# Patient Record
Sex: Female | Born: 1945 | Race: White | Hispanic: No | Marital: Married | Smoking: Never smoker
Health system: Southern US, Community
[De-identification: ages and names within clinical notes are randomized; demographics above are authoritative.]

## PROBLEM LIST (undated history)

## (undated) DIAGNOSIS — IMO0001 Reserved for inherently not codable concepts without codable children: Secondary | ICD-10-CM

## (undated) DIAGNOSIS — M1990 Unspecified osteoarthritis, unspecified site: Secondary | ICD-10-CM

## (undated) DIAGNOSIS — Z98891 History of uterine scar from previous surgery: Secondary | ICD-10-CM

## (undated) DIAGNOSIS — E789 Disorder of lipoprotein metabolism, unspecified: Secondary | ICD-10-CM

## (undated) DIAGNOSIS — R030 Elevated blood-pressure reading, without diagnosis of hypertension: Secondary | ICD-10-CM

## (undated) DIAGNOSIS — Z9049 Acquired absence of other specified parts of digestive tract: Secondary | ICD-10-CM

## (undated) DIAGNOSIS — E7800 Pure hypercholesterolemia, unspecified: Secondary | ICD-10-CM

## (undated) DIAGNOSIS — C439 Malignant melanoma of skin, unspecified: Secondary | ICD-10-CM

## (undated) DIAGNOSIS — I10 Essential (primary) hypertension: Secondary | ICD-10-CM

## (undated) DIAGNOSIS — E785 Hyperlipidemia, unspecified: Secondary | ICD-10-CM

## (undated) DIAGNOSIS — C801 Malignant (primary) neoplasm, unspecified: Secondary | ICD-10-CM

## (undated) DIAGNOSIS — Z9089 Acquired absence of other organs: Secondary | ICD-10-CM

## (undated) HISTORY — DX: Disorder of lipoprotein metabolism, unspecified: E78.9

## (undated) HISTORY — DX: Elevated blood-pressure reading, without diagnosis of hypertension: R03.0

## (undated) HISTORY — DX: Hyperlipidemia, unspecified: E78.5

## (undated) HISTORY — PX: MOHS SURGERY: SUR867

## (undated) HISTORY — DX: Malignant melanoma of skin, unspecified: C43.9

## (undated) HISTORY — DX: History of uterine scar from previous surgery: Z98.891

## (undated) HISTORY — DX: Essential (primary) hypertension: I10

## (undated) HISTORY — DX: Acquired absence of other organs: Z90.89

## (undated) HISTORY — DX: Acquired absence of other specified parts of digestive tract: Z90.49

## (undated) HISTORY — DX: Pure hypercholesterolemia, unspecified: E78.00

## (undated) HISTORY — DX: Malignant (primary) neoplasm, unspecified: C80.1

## (undated) HISTORY — DX: Unspecified osteoarthritis, unspecified site: M19.90

## (undated) HISTORY — DX: Reserved for inherently not codable concepts without codable children: IMO0001

## (undated) HISTORY — PX: APPENDECTOMY: SHX54

---

## 1963-10-11 DIAGNOSIS — Z9089 Acquired absence of other organs: Secondary | ICD-10-CM

## 1963-10-11 HISTORY — DX: Acquired absence of other organs: Z90.89

## 1963-10-11 HISTORY — PX: TONSILLECTOMY: SUR1361

## 1997-12-05 ENCOUNTER — Ambulatory Visit (HOSPITAL_COMMUNITY): Admission: RE | Admit: 1997-12-05 | Discharge: 1997-12-05 | Payer: Self-pay | Admitting: Obstetrics and Gynecology

## 1999-02-15 ENCOUNTER — Other Ambulatory Visit: Admission: RE | Admit: 1999-02-15 | Discharge: 1999-02-15 | Payer: Self-pay | Admitting: Obstetrics and Gynecology

## 1999-11-22 ENCOUNTER — Ambulatory Visit (HOSPITAL_COMMUNITY): Admission: RE | Admit: 1999-11-22 | Discharge: 1999-11-22 | Payer: Self-pay | Admitting: Obstetrics and Gynecology

## 1999-11-22 ENCOUNTER — Encounter: Payer: Self-pay | Admitting: Obstetrics and Gynecology

## 2000-01-04 ENCOUNTER — Other Ambulatory Visit: Admission: RE | Admit: 2000-01-04 | Discharge: 2000-01-04 | Payer: Self-pay | Admitting: Obstetrics and Gynecology

## 2000-01-04 ENCOUNTER — Encounter (INDEPENDENT_AMBULATORY_CARE_PROVIDER_SITE_OTHER): Payer: Self-pay | Admitting: Specialist

## 2000-02-17 ENCOUNTER — Other Ambulatory Visit: Admission: RE | Admit: 2000-02-17 | Discharge: 2000-02-17 | Payer: Self-pay | Admitting: Obstetrics and Gynecology

## 2001-03-15 ENCOUNTER — Other Ambulatory Visit: Admission: RE | Admit: 2001-03-15 | Discharge: 2001-03-15 | Payer: Self-pay | Admitting: Obstetrics and Gynecology

## 2002-02-05 ENCOUNTER — Ambulatory Visit (HOSPITAL_COMMUNITY): Admission: RE | Admit: 2002-02-05 | Discharge: 2002-02-05 | Payer: Self-pay | Admitting: Obstetrics and Gynecology

## 2002-02-05 ENCOUNTER — Encounter: Payer: Self-pay | Admitting: Obstetrics and Gynecology

## 2002-04-09 ENCOUNTER — Other Ambulatory Visit: Admission: RE | Admit: 2002-04-09 | Discharge: 2002-04-09 | Payer: Self-pay | Admitting: Internal Medicine

## 2003-04-11 ENCOUNTER — Other Ambulatory Visit: Admission: RE | Admit: 2003-04-11 | Discharge: 2003-04-11 | Payer: Self-pay | Admitting: Internal Medicine

## 2003-05-13 ENCOUNTER — Encounter: Payer: Self-pay | Admitting: Internal Medicine

## 2003-05-13 ENCOUNTER — Encounter: Admission: RE | Admit: 2003-05-13 | Discharge: 2003-05-13 | Payer: Self-pay | Admitting: Internal Medicine

## 2004-05-04 ENCOUNTER — Ambulatory Visit (HOSPITAL_COMMUNITY): Admission: RE | Admit: 2004-05-04 | Discharge: 2004-05-04 | Payer: Self-pay | Admitting: Internal Medicine

## 2006-02-03 ENCOUNTER — Emergency Department (HOSPITAL_COMMUNITY): Admission: EM | Admit: 2006-02-03 | Discharge: 2006-02-03 | Payer: Self-pay | Admitting: Emergency Medicine

## 2006-02-07 ENCOUNTER — Ambulatory Visit (HOSPITAL_COMMUNITY): Admission: RE | Admit: 2006-02-07 | Discharge: 2006-02-07 | Payer: Self-pay | Admitting: Orthopedic Surgery

## 2006-04-27 ENCOUNTER — Other Ambulatory Visit: Admission: RE | Admit: 2006-04-27 | Discharge: 2006-04-27 | Payer: Self-pay | Admitting: Internal Medicine

## 2006-10-13 ENCOUNTER — Ambulatory Visit (HOSPITAL_COMMUNITY): Admission: RE | Admit: 2006-10-13 | Discharge: 2006-10-13 | Payer: Self-pay | Admitting: Internal Medicine

## 2007-07-03 ENCOUNTER — Other Ambulatory Visit: Admission: RE | Admit: 2007-07-03 | Discharge: 2007-07-03 | Payer: Self-pay | Admitting: Internal Medicine

## 2008-07-01 ENCOUNTER — Other Ambulatory Visit: Admission: RE | Admit: 2008-07-01 | Discharge: 2008-07-01 | Payer: Self-pay | Admitting: Internal Medicine

## 2008-08-28 ENCOUNTER — Ambulatory Visit: Payer: Self-pay | Admitting: Internal Medicine

## 2009-02-18 ENCOUNTER — Ambulatory Visit: Payer: Self-pay | Admitting: Internal Medicine

## 2009-08-07 ENCOUNTER — Ambulatory Visit: Payer: Self-pay | Admitting: Internal Medicine

## 2009-08-07 ENCOUNTER — Other Ambulatory Visit: Admission: RE | Admit: 2009-08-07 | Discharge: 2009-08-07 | Payer: Self-pay | Admitting: Internal Medicine

## 2009-08-28 ENCOUNTER — Ambulatory Visit (HOSPITAL_COMMUNITY): Admission: RE | Admit: 2009-08-28 | Discharge: 2009-08-28 | Payer: Self-pay | Admitting: Internal Medicine

## 2010-02-18 ENCOUNTER — Ambulatory Visit: Payer: Self-pay | Admitting: Internal Medicine

## 2011-05-18 ENCOUNTER — Encounter: Payer: Self-pay | Admitting: Family Medicine

## 2011-05-18 ENCOUNTER — Ambulatory Visit (INDEPENDENT_AMBULATORY_CARE_PROVIDER_SITE_OTHER): Payer: Medicare Other | Admitting: Family Medicine

## 2011-05-18 ENCOUNTER — Other Ambulatory Visit: Payer: Self-pay | Admitting: Family Medicine

## 2011-05-18 DIAGNOSIS — L940 Localized scleroderma [morphea]: Secondary | ICD-10-CM

## 2011-05-18 DIAGNOSIS — L900 Lichen sclerosus et atrophicus: Secondary | ICD-10-CM

## 2011-05-18 DIAGNOSIS — I10 Essential (primary) hypertension: Secondary | ICD-10-CM | POA: Insufficient documentation

## 2011-05-18 DIAGNOSIS — C439 Malignant melanoma of skin, unspecified: Secondary | ICD-10-CM

## 2011-05-18 DIAGNOSIS — E119 Type 2 diabetes mellitus without complications: Secondary | ICD-10-CM

## 2011-05-18 DIAGNOSIS — E7800 Pure hypercholesterolemia, unspecified: Secondary | ICD-10-CM

## 2011-05-18 HISTORY — DX: Pure hypercholesterolemia, unspecified: E78.00

## 2011-05-18 HISTORY — DX: Malignant melanoma of skin, unspecified: C43.9

## 2011-05-18 MED ORDER — CLOBETASOL PROP EMOLLIENT BASE 0.05 % EX CREA: 1.0000 "application " | TOPICAL_CREAM | CUTANEOUS | Status: DC

## 2011-05-18 MED ORDER — LISINOPRIL 10 MG PO TABS: 10.0000 mg | ORAL_TABLET | Freq: Every day | ORAL | Status: DC

## 2011-05-18 NOTE — Progress Notes (Signed)
  Subjective:    Patient ID: Courtney Crosby, female    DOB: 13-May-1946, 65 y.o.   MRN: [id number]  HPI Comments: Has a discoloration and vulvar irritation for 1.5 years.  Has tried estrogen topically, anti-fungals and no real relief.  Told by PCP to have this looked into.     Review of Systems  Constitutional: Negative for fever, activity change, appetite change and fatigue.  HENT: Negative for nosebleeds, rhinorrhea, sneezing, neck pain, postnasal drip and sinus pressure.   Eyes: Negative for redness.  Respiratory: Negative for cough, chest tightness, shortness of breath and wheezing.   Cardiovascular: Negative for chest pain and leg swelling.  Gastrointestinal: Negative for abdominal pain and abdominal distention.  Genitourinary: Negative for dysuria, frequency, decreased urine volume, vaginal bleeding and pelvic pain.       Vulvar irritation  Musculoskeletal: Negative for myalgias and joint swelling.  Skin: Negative for color change.  Neurological: Negative for speech difficulty, numbness and headaches.       Objective:   Physical Exam  Constitutional: She is oriented to person, place, and time. She appears well-developed and well-nourished.  HENT:  Head: Normocephalic and atraumatic.  Neck: Normal range of motion.  Cardiovascular: Normal rate.   Pulmonary/Chest: Effort normal.  Abdominal: Soft.  Genitourinary:       Labia majora are hypopigmented and with some loss of integrity  Musculoskeletal: Normal range of motion.  Neurological: She is alert and oriented to person, place, and time.   Patient identified, informed consent signed, copy in chart, time out performed.    Area cleansed with Alcohol.  Injected with 1% Lidocaine with Epi.  3 mL. Area cleaned with Betadine and 2 mm punch biopsy performed without difficulty.  Hemostasis obtained with Silver Nitrate.  Patient tolerated procedure well.      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  With results Clobetasol.

## 2011-05-18 NOTE — Progress Notes (Signed)
Has a discoloration in perineal area that her primary care would like [place] to check out.

## 2011-07-06 ENCOUNTER — Encounter: Payer: Self-pay | Admitting: Internal Medicine

## 2011-07-07 ENCOUNTER — Other Ambulatory Visit: Payer: Medicare Other | Admitting: Internal Medicine

## 2011-07-07 DIAGNOSIS — I10 Essential (primary) hypertension: Secondary | ICD-10-CM

## 2011-07-07 DIAGNOSIS — E785 Hyperlipidemia, unspecified: Secondary | ICD-10-CM

## 2011-07-07 DIAGNOSIS — Z0000 Encounter for general adult medical examination without abnormal findings: Secondary | ICD-10-CM

## 2011-07-07 LAB — LIPID PANEL
Cholesterol: 220 mg/dL — ABNORMAL HIGH (ref 0–200)
Total CHOL/HDL Ratio: 5 Ratio
Triglycerides: 162 mg/dL — ABNORMAL HIGH (ref <150)
VLDL: 32 mg/dL (ref 0–40)

## 2011-07-07 LAB — CBC WITH DIFFERENTIAL/PLATELET
Basophils Relative: 1 % (ref 0–1)
Eosinophils Absolute: 0.1 10*3/uL (ref 0.0–0.7)
Eosinophils Relative: 2 % (ref 0–5)
Lymphs Abs: 2.2 10*3/uL (ref 0.7–4.0)
MCV: 91.7 fL (ref 78.0–100.0)
Neutro Abs: 2.9 10*3/uL (ref 1.7–7.7)
Platelets: 228 10*3/uL (ref 150–400)
RBC: 5.03 MIL/uL (ref 3.87–5.11)
RDW: 13.5 % (ref 11.5–15.5)

## 2011-07-07 LAB — COMPREHENSIVE METABOLIC PANEL
ALT: 21 U/L (ref 0–35)
Albumin: 4.2 g/dL (ref 3.5–5.2)
CO2: 27 mEq/L (ref 19–32)
Glucose, Bld: 108 mg/dL — ABNORMAL HIGH (ref 70–99)
Potassium: 4.4 mEq/L (ref 3.5–5.3)

## 2011-07-07 LAB — TSH: TSH: 1.364 u[IU]/mL (ref 0.350–4.500)

## 2011-07-07 LAB — HEMOGLOBIN A1C: Mean Plasma Glucose: 140 mg/dL — ABNORMAL HIGH (ref <117)

## 2011-07-11 ENCOUNTER — Encounter: Payer: Self-pay | Admitting: Internal Medicine

## 2011-07-11 ENCOUNTER — Ambulatory Visit (INDEPENDENT_AMBULATORY_CARE_PROVIDER_SITE_OTHER): Payer: Medicare Other | Admitting: Internal Medicine

## 2011-07-11 VITALS — BP 128/78 | HR 80 | Temp 97.5°F | Ht 63.0 in | Wt 185.0 lb

## 2011-07-11 DIAGNOSIS — I10 Essential (primary) hypertension: Secondary | ICD-10-CM

## 2011-07-11 DIAGNOSIS — F419 Anxiety disorder, unspecified: Secondary | ICD-10-CM

## 2011-07-11 DIAGNOSIS — R609 Edema, unspecified: Secondary | ICD-10-CM

## 2011-07-11 DIAGNOSIS — Z0000 Encounter for general adult medical examination without abnormal findings: Secondary | ICD-10-CM

## 2011-07-11 DIAGNOSIS — K219 Gastro-esophageal reflux disease without esophagitis: Secondary | ICD-10-CM

## 2011-07-11 LAB — POCT URINALYSIS DIPSTICK
Glucose, UA: NEGATIVE
Ketones, UA: NEGATIVE
Nitrite, UA: NEGATIVE
Spec Grav, UA: 1.01

## 2011-07-11 MED ORDER — LISINOPRIL 10 MG PO TABS: 10.0000 mg | ORAL_TABLET | Freq: Every day | ORAL | Status: DC

## 2011-08-14 DIAGNOSIS — K219 Gastro-esophageal reflux disease without esophagitis: Secondary | ICD-10-CM | POA: Insufficient documentation

## 2011-08-14 DIAGNOSIS — R609 Edema, unspecified: Secondary | ICD-10-CM | POA: Insufficient documentation

## 2011-08-14 DIAGNOSIS — F419 Anxiety disorder, unspecified: Secondary | ICD-10-CM | POA: Insufficient documentation

## 2011-08-14 NOTE — Patient Instructions (Signed)
Please try the diet exercise and lose some weight. Encourage you to watch Accu-Cheks at least once daily. Low-fat diet. Calorie restriction 1500 calories daily.

## 2011-08-14 NOTE — Progress Notes (Signed)
  Subjective:    Patient ID: Courtney Crosby, female    DOB: 07-05-1946, 65 y.o.   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 History of hypertension, GE reflux, hyperlipidemia, diabetes mellitus, dependent edema, anxiety.  History of melanoma right arm, herpes zoster left trunk 2006, fractured right humerus April 2007. Patient declines Pneumovax immunization. Sees Dr. Emily Filbert for eye exam every 2 years.Tdap 2007.  History of anxiety related to caring for relatives. Retirement has relieved some of the stress.  Does not smoke or consume alcohol. Is married. Husband is retired from the police department. 2 adult children a son and a daughter. Has a brother and a sister. Mother with history of diabetes arthritis and high blood pressure.    Review of Systems  Constitutional: Negative.   HENT: Negative.   Eyes: Negative.   Respiratory: Negative.   Cardiovascular: Negative.   Genitourinary: Negative.   Musculoskeletal: Positive for arthralgias.  Neurological: Negative.   Hematological: Negative.   Psychiatric/Behavioral:       Anxiety from dealing with relatives       Objective:   Physical Exam  Vitals reviewed. Constitutional: She is oriented to person, place, and time. She appears well-developed and well-nourished. No distress.  HENT:  Head: Normocephalic and atraumatic.  Left Ear: External ear normal.  Mouth/Throat: Oropharynx is clear and moist.  Eyes: Conjunctivae and EOM are normal. Pupils are equal, round, and reactive to light. No scleral icterus.  Neck: Neck supple. No JVD present. No thyromegaly present.  Cardiovascular: Normal rate, regular rhythm, normal heart sounds and intact distal pulses.   No murmur heard. Abdominal: Soft. Bowel sounds are normal. She exhibits no mass. There is no tenderness. There is no rebound.  Musculoskeletal: She exhibits no edema.       Diabetic foot exam shows no ulcers.    Lymphadenopathy:    She has no cervical adenopathy.  Neurological: She is alert and oriented to person, place, and time. She has normal reflexes. No cranial nerve deficit. Coordination normal.  Skin: Skin is warm and dry. No rash noted. She is not diaphoretic.  Psychiatric: She has a normal mood and affect. Her behavior is normal.          Assessment & Plan:  Diabetes mellitus  Hyperlipidemia  Hypertension  GE reflux  Anxiety  Dependent edema  Plan: Patient prefers not to be on diabetic medication at this point in time. Declines Pneumovax immunization. Does not want to be a lipid-lowering medication either. Return in 6 months for fasting lipid panel and hemoglobin A1c. Encouraged diet and exercise.

## 2011-10-21 ENCOUNTER — Other Ambulatory Visit: Payer: Self-pay | Admitting: Dermatology

## 2012-01-09 ENCOUNTER — Other Ambulatory Visit: Payer: Medicare Other | Admitting: Internal Medicine

## 2012-01-09 DIAGNOSIS — E785 Hyperlipidemia, unspecified: Secondary | ICD-10-CM

## 2012-01-09 LAB — LIPID PANEL
Cholesterol: 222 mg/dL — ABNORMAL HIGH (ref 0–200)
Total CHOL/HDL Ratio: 4.8 Ratio

## 2012-01-09 LAB — HEMOGLOBIN A1C: Mean Plasma Glucose: 143 mg/dL — ABNORMAL HIGH (ref <117)

## 2012-01-10 ENCOUNTER — Ambulatory Visit (INDEPENDENT_AMBULATORY_CARE_PROVIDER_SITE_OTHER): Payer: Medicare Other | Admitting: Internal Medicine

## 2012-01-10 ENCOUNTER — Encounter: Payer: Self-pay | Admitting: Internal Medicine

## 2012-01-10 VITALS — BP 148/94 | HR 76 | Wt 192.0 lb

## 2012-01-10 DIAGNOSIS — E119 Type 2 diabetes mellitus without complications: Secondary | ICD-10-CM

## 2012-01-10 DIAGNOSIS — E785 Hyperlipidemia, unspecified: Secondary | ICD-10-CM

## 2012-01-10 DIAGNOSIS — E669 Obesity, unspecified: Secondary | ICD-10-CM

## 2012-01-10 DIAGNOSIS — I10 Essential (primary) hypertension: Secondary | ICD-10-CM

## 2012-01-10 MED ORDER — CLOBETASOL PROP EMOLLIENT BASE 0.05 % EX CREA: 1.0000 "application " | TOPICAL_CREAM | CUTANEOUS | Status: DC

## 2012-01-10 MED ORDER — LISINOPRIL 5 MG PO TABS: 5.0000 mg | ORAL_TABLET | Freq: Every day | ORAL | Status: DC

## 2012-01-10 MED ORDER — ESTRADIOL 0.1 MG/GM VA CREA: 2.0000 g | TOPICAL_CREAM | Freq: Every day | VAGINAL | Status: DC

## 2012-02-05 NOTE — Progress Notes (Signed)
  Subjective:    Patient ID: Courtney Crosby, female    DOB: 1946/09/29, 66 y.o.   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 Since retiring, hasn't really committed to diet exercise and weight loss. Enjoying being retired however.    Review of Systems     Objective:   Physical Exam chest clear to auscultation; cardiac exam regular rate and rhythm normal S1 and S2; extremities without edema. Diabetic foot exam normal without ulcers.        Assessment & Plan:  Hyperlipidemia  Hypertension  Obesity  Diabetes mellitus type 2  Plan: Encouraged diet exercise and weight loss. Return in 6 months for physical examination. Patient does not want to take medication for diabetes.

## 2012-02-05 NOTE — Patient Instructions (Signed)
Please try the diet exercise and lose weight. Return in 6 months for physical exam.

## 2012-03-14 ENCOUNTER — Telehealth: Payer: Self-pay | Admitting: Internal Medicine

## 2012-03-14 NOTE — Telephone Encounter (Signed)
Lipitor prior to Crestor which she also couldn't do.  I asked her if she had advised you that the Crestor didn't work.  She said "no".  I can't take that mess and try to accomplish anything.  I said...do you think there is value in getting your labs even though you are NOT taking the medicine to see how your cholesterol is at this point?  She said, NO!  I just want to schedule my year exam.  So, I cancelled her July appointment.  However, I wanted you to know that she is NOT taking the cholesterol medicine nor was she interested in getting her labs drawn to keep a check on it.  So, her CPE is not until 10/4 (labs 10/3).  Just an FYI for you.  Thanks!

## 2012-03-14 NOTE — Telephone Encounter (Signed)
OK. Note patient not able to take Crestor and next appt is CPE in October. Manage with diet and exercise.

## 2012-04-17 ENCOUNTER — Other Ambulatory Visit: Payer: Medicare Other | Admitting: Internal Medicine

## 2012-04-19 ENCOUNTER — Ambulatory Visit: Payer: Medicare Other | Admitting: Internal Medicine

## 2012-07-12 ENCOUNTER — Other Ambulatory Visit: Payer: Medicare Other | Admitting: Internal Medicine

## 2012-07-12 DIAGNOSIS — E119 Type 2 diabetes mellitus without complications: Secondary | ICD-10-CM

## 2012-07-12 DIAGNOSIS — I10 Essential (primary) hypertension: Secondary | ICD-10-CM

## 2012-07-12 DIAGNOSIS — E785 Hyperlipidemia, unspecified: Secondary | ICD-10-CM

## 2012-07-12 LAB — CBC WITH DIFFERENTIAL/PLATELET
HCT: 45.2 % (ref 36.0–46.0)
Lymphocytes Relative: 41 % (ref 12–46)
MCH: 30.1 pg (ref 26.0–34.0)
MCHC: 34.3 g/dL (ref 30.0–36.0)
MCV: 87.8 fL (ref 78.0–100.0)
Monocytes Absolute: 0.6 10*3/uL (ref 0.1–1.0)
Neutro Abs: 2.9 10*3/uL (ref 1.7–7.7)
Neutrophils Relative %: 46 % (ref 43–77)
WBC: 6.1 10*3/uL (ref 4.0–10.5)

## 2012-07-12 LAB — TSH: TSH: 1.455 u[IU]/mL (ref 0.350–4.500)

## 2012-07-12 LAB — COMPREHENSIVE METABOLIC PANEL
AST: 29 U/L (ref 0–37)
Albumin: 4 g/dL (ref 3.5–5.2)
Chloride: 102 mEq/L (ref 96–112)
Sodium: 139 mEq/L (ref 135–145)
Total Bilirubin: 0.6 mg/dL (ref 0.3–1.2)

## 2012-07-12 LAB — LIPID PANEL
Cholesterol: 202 mg/dL — ABNORMAL HIGH (ref 0–200)
LDL Cholesterol: 116 mg/dL — ABNORMAL HIGH (ref 0–99)

## 2012-07-12 LAB — HEMOGLOBIN A1C: Mean Plasma Glucose: 143 mg/dL — ABNORMAL HIGH (ref <117)

## 2012-07-13 ENCOUNTER — Encounter: Payer: Self-pay | Admitting: Internal Medicine

## 2012-07-13 ENCOUNTER — Ambulatory Visit (INDEPENDENT_AMBULATORY_CARE_PROVIDER_SITE_OTHER): Payer: Medicare Other | Admitting: Internal Medicine

## 2012-07-13 ENCOUNTER — Other Ambulatory Visit (HOSPITAL_COMMUNITY)
Admission: RE | Admit: 2012-07-13 | Discharge: 2012-07-13 | Disposition: A | Discharge status: Home / Self Care | Payer: Medicare Other | Source: Ambulatory Visit | Attending: Internal Medicine | Admitting: Internal Medicine

## 2012-07-13 VITALS — BP 128/88 | HR 76 | Ht 63.0 in | Wt 190.0 lb

## 2012-07-13 DIAGNOSIS — F411 Generalized anxiety disorder: Secondary | ICD-10-CM

## 2012-07-13 DIAGNOSIS — E8881 Metabolic syndrome: Secondary | ICD-10-CM

## 2012-07-13 DIAGNOSIS — E669 Obesity, unspecified: Secondary | ICD-10-CM

## 2012-07-13 DIAGNOSIS — E785 Hyperlipidemia, unspecified: Secondary | ICD-10-CM

## 2012-07-13 DIAGNOSIS — I10 Essential (primary) hypertension: Secondary | ICD-10-CM

## 2012-07-13 DIAGNOSIS — K219 Gastro-esophageal reflux disease without esophagitis: Secondary | ICD-10-CM

## 2012-07-13 DIAGNOSIS — E119 Type 2 diabetes mellitus without complications: Secondary | ICD-10-CM

## 2012-07-13 DIAGNOSIS — Z85820 Personal history of malignant melanoma of skin: Secondary | ICD-10-CM

## 2012-07-13 DIAGNOSIS — Z01419 Encounter for gynecological examination (general) (routine) without abnormal findings: Secondary | ICD-10-CM | POA: Insufficient documentation

## 2012-07-13 DIAGNOSIS — Z124 Encounter for screening for malignant neoplasm of cervix: Secondary | ICD-10-CM

## 2012-07-13 LAB — POCT URINALYSIS DIPSTICK
Blood, UA: NEGATIVE
Glucose, UA: NEGATIVE
Nitrite, UA: NEGATIVE
Spec Grav, UA: 1.015
pH, UA: 5.5

## 2012-07-13 LAB — VITAMIN D 25 HYDROXY (VIT D DEFICIENCY, FRACTURES): Vit D, 25-Hydroxy: 39 ng/mL (ref 30–89)

## 2012-07-13 NOTE — Patient Instructions (Addendum)
Continue same medications and return in 6 months. Watch diet and exercise. Try to lose weight.

## 2012-12-15 NOTE — Progress Notes (Signed)
Subjective:    Patient ID: Courtney Crosby, female    DOB: 12-Oct-1945, 67 y.o.   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 Enjoying retirement. History of hypertension, type 2 diabetes mellitus, hyperlipidemia, obesity, metabolic syndrome for health maintenance and evaluation of medical problems. History of dependent edema, anxiety related to family stress, GERD, melanoma right arm [address]81, [id number] zoster left trunk 2006; Fractured right humerus 2007  SHx: married with 2 adult children- a son and a daughter. Husband is retired Emergency planning/management officer. Nonsmoker No alcohol consumtion.  Appendectomy bilateral ingrown toe nail surgery, C-section 1976 and 89 FHx Mother with hx DM, HTN    Review of Systems  Constitutional: Negative.   HENT: Negative.   Eyes: Negative.   Respiratory: Negative.   Cardiovascular: Negative.   Gastrointestinal:       GERD  Endocrine: Negative.   Neurological: Negative.   Hematological: Negative.   Psychiatric/Behavioral:       Anxiety related to family stress       Objective:   Physical Exam  Vitals reviewed. Constitutional: She is oriented to person, place, and time. She appears well-developed and well-nourished. No distress.  HENT:  Head: Normocephalic and atraumatic.  Right Ear: External ear normal.  Left Ear: External ear normal.  Mouth/Throat: Oropharynx is clear and moist.  Eyes: Conjunctivae and EOM are normal. Pupils are equal, round, and reactive to light. Right eye exhibits no discharge. Left eye exhibits no discharge. No scleral icterus.  Neck: Neck supple. No JVD present. No thyromegaly present.  Cardiovascular: Normal rate, regular rhythm, normal heart sounds and intact distal pulses.   No murmur heard. Pulmonary/Chest: Effort normal and breath sounds normal. No respiratory distress. She has no wheezes. She has no rales. She exhibits no tenderness.   Breasts normal female.  Abdominal: Soft. Bowel sounds are normal. She exhibits no distension and no mass. There is no tenderness. There is no rebound and no guarding.  Genitourinary:  Pap taken. Bimanual normal  Musculoskeletal: Normal range of motion.  Trace LE edema  Lymphadenopathy:    She has no cervical adenopathy.  Neurological: She is alert and oriented to person, place, and time. She has normal reflexes. No cranial nerve deficit. Coordination normal.  Skin: Skin is warm and dry. No rash noted. She is not diaphoretic.  Psychiatric: She has a normal mood and affect. Her behavior is normal. Judgment and thought content normal.          Assessment & Plan:  HTN Hyperlipidemia GERD Anxiety related to family stress. Tries to take care of everyone and gets worn out. Type 2 diabetes mellitus controlled with diet does not want to be on medication Plan: Return in 6 months for recheck with lipid panel, Hgb AIC, and OV Declines to be on lipid lowering medication, decline Pneumovax immunization, declines to be on med for DM   Subjective:   Patient presents for Medicare Annual/Subsequent preventive examination.   Review Past Medical/Family/Social: see above   Risk Factors  Current exercise habits: sedentary Dietary issues discussed: low fat low carb  Cardiac risk factors: Obesity , HTN, DM, Hyperlipidemia  Depression Screen  (Note: if answer to either of the following is "Yes", a more complete depression screening is indicated)  Over the past two weeks, have you felt down, depressed or hopeless? no  Over the past two weeks, have you felt little interest or pleasure in doing things? no Have  you lost interest or pleasure in daily life? no Do you often feel hopeless? no Do you cry easily over simple problems? No   Activities of Daily Living  In your present state of health, do you have any difficulty performing the following activities?:  Driving? No  Managing money? No   Feeding yourself? No  Getting from bed to chair? No  Climbing a flight of stairs? No  Preparing food and eating?: No  Bathing or showering? No  Getting dressed: No  Getting to the toilet? No  Using the toilet:No  Moving around from place to place: No  In the past year have you fallen or had a near fall?:No  Are you sexually active? No  Do you have more than one partner? No   Hearing Difficulties: No  Do you often ask people to speak up or repeat themselves? No  Do you experience ringing or noises in your ears? no Do you have difficulty understanding soft or whispered voices? No  Do you feel that you have a problem with memory? no Do you often misplace items? No  Do you feel safe at home? Yes   Cognitive Testing  Alert? Yes Normal Appearance?Yes  Oriented to person? Yes Place? Yes  Time? Yes  Recall of three objects? Yes  Can perform simple calculations? Yes  Displays appropriate judgment?Yes  Can read the correct time from a watch face?Yes   List the Names of Other Physician/Practitioners you currently use:  opthamologist   Indicate any recent Medical Services you may have received from other than [facility] providers in the past year (date may be approximate).   Screening Tests / Date  See above Colonoscopy                     Zostavax  Mammogram  Influenza Vaccine  Tetanus/tdap   Objective:    Body mass index:  BP     Pulse     Ht       Wt       BMI      SpO2   See VS above    General appearance: Appears stated age and mildly obese  Head: Normocephalic, without obvious abnormality, atraumatic  Eyes: conj clear, EOMi PEERLA  Ears: normal TM's and external ear canals both ears  Nose: Nares normal. Septum midline. Mucosa normal. No drainage or sinus tenderness.  Throat: lips, mucosa, and tongue normal; teeth and gums normal  Neck: no adenopathy, no carotid bruit, no JVD, supple, symmetrical, trachea midline and thyroid not enlarged, symmetric, no  tenderness/mass/nodules  No CVA tenderness.  Lungs: clear to auscultation bilaterally  Breasts: normal appearance, no masses or tenderness Heart: regular rate and rhythm, S1, S2 normal, no murmur, click, rub or gallop  Abdomen: soft, non-tender; bowel sounds normal; no masses, no organomegaly  Musculoskeletal: ROM normal in all joints, no crepitus, no deformity, Normal muscle strengthen. Back  is symmetric, no curvature. Skin: Skin color, texture, turgor normal. No rashes or lesions  Lymph nodes: Cervical, supraclavicular, and axillary nodes normal.  Neurologic: CN 2 -12 Normal, Normal symmetric reflexes. Normal coordination and gait  Psych: Alert & Oriented x 3, Mood appear stable.    Assessment:    Annual wellness medicare exam   Plan:    Refuses pneumovax; refuses lipid lowering and diabetic medication. Remind about yearly eye exam . Remind about annual maamogram Diet review for nutrition referral? Yes ____ Not Indicated __x__ refused Patient Instructions (the written plan) was given  to the patient.  Medicare Attestation  I have personally reviewed:  The patient's medical and social history  Their use of alcohol, tobacco or illicit drugs  Their current medications and supplements  The patient's functional ability including ADLs,fall risks, home safety risks, cognitive, and hearing and visual impairment  Diet and physical activities  Evidence for depression or mood disorders  The patient's weight, height, BMI, and visual acuity have been recorded in the chart. I have made referrals, counseling, and provided education to the patient based on review of the above and I have provided the patient with a written personalized care plan for preventive services.

## 2013-01-14 ENCOUNTER — Ambulatory Visit (INDEPENDENT_AMBULATORY_CARE_PROVIDER_SITE_OTHER): Payer: Medicare Other | Admitting: Internal Medicine

## 2013-01-14 VITALS — BP 132/84 | Temp 98.0°F | Wt 189.0 lb

## 2013-01-14 DIAGNOSIS — E119 Type 2 diabetes mellitus without complications: Secondary | ICD-10-CM

## 2013-01-14 DIAGNOSIS — E669 Obesity, unspecified: Secondary | ICD-10-CM

## 2013-01-14 DIAGNOSIS — E785 Hyperlipidemia, unspecified: Secondary | ICD-10-CM

## 2013-01-14 DIAGNOSIS — I10 Essential (primary) hypertension: Secondary | ICD-10-CM

## 2013-01-14 DIAGNOSIS — E8881 Metabolic syndrome: Secondary | ICD-10-CM

## 2013-01-14 LAB — HEMOGLOBIN A1C: Hgb A1c MFr Bld: 6.7 % — ABNORMAL HIGH (ref <5.7)

## 2013-01-14 NOTE — Progress Notes (Signed)
  Subjective:    Patient ID: Courtney Crosby, female    DOB: [birth year], 67 y.o.   MRN: [id number]  HPI Patient in today to followup on type 2 diabetes mellitus, hypertension and hyperlipidemia. Does not want to be a lipid-lowering therapy. Remains overweight. Has metabolic syndrome. I had hoped with retirement she would be able to diet and exercise bit more but she says she's had some stress in her family.    Review of Systems     Objective:   Physical Exam skin is warm and dry. Nodes none. Neck supple without JVD thyromegaly or carotid bruits. Chest clear to auscultation. Cardiac exam regular rate and rhythm normal S1 and S2. Extremities without edema. C. diabetic foot form.        Assessment & Plan:  HTN-stable on lisinopril  Hyperlipidemia- does not want to be on lipid lowering meds and is not fasting today. Recheck in 6 months at time of PE.  Type 2 diabetes mellitus-hemoglobin A1c 6.7%  Plan: Reminded regarding health maintenance services such as annual mammogram, annual diabetic eye exam. Return in 6 months. Encouraged diet exercise and weight loss. Order given for mammogram.

## 2013-01-14 NOTE — Patient Instructions (Addendum)
Continue same meds and return in 6 months for PE

## 2013-04-01 ENCOUNTER — Other Ambulatory Visit: Payer: Self-pay | Admitting: Internal Medicine

## 2013-07-10 ENCOUNTER — Encounter: Payer: Self-pay | Admitting: Internal Medicine

## 2013-07-10 ENCOUNTER — Other Ambulatory Visit: Payer: Self-pay | Admitting: Internal Medicine

## 2013-07-10 DIAGNOSIS — Z780 Asymptomatic menopausal state: Secondary | ICD-10-CM

## 2013-07-10 DIAGNOSIS — Z1231 Encounter for screening mammogram for malignant neoplasm of breast: Secondary | ICD-10-CM

## 2013-07-18 ENCOUNTER — Other Ambulatory Visit: Payer: Medicare Other | Admitting: Internal Medicine

## 2013-07-18 DIAGNOSIS — E119 Type 2 diabetes mellitus without complications: Secondary | ICD-10-CM

## 2013-07-18 DIAGNOSIS — E559 Vitamin D deficiency, unspecified: Secondary | ICD-10-CM

## 2013-07-18 DIAGNOSIS — I10 Essential (primary) hypertension: Secondary | ICD-10-CM

## 2013-07-18 DIAGNOSIS — E785 Hyperlipidemia, unspecified: Secondary | ICD-10-CM

## 2013-07-18 DIAGNOSIS — Z130 Encounter for screening for diseases of the blood and blood-forming organs and certain disorders involving the immune mechanism: Secondary | ICD-10-CM

## 2013-07-18 DIAGNOSIS — E039 Hypothyroidism, unspecified: Secondary | ICD-10-CM

## 2013-07-18 LAB — COMPREHENSIVE METABOLIC PANEL
ALT: 35 U/L (ref 0–35)
Alkaline Phosphatase: 57 U/L (ref 39–117)
BUN: 10 mg/dL (ref 6–23)
Chloride: 104 mEq/L (ref 96–112)
Creat: 0.69 mg/dL (ref 0.50–1.10)
Sodium: 140 mEq/L (ref 135–145)
Total Bilirubin: 0.7 mg/dL (ref 0.3–1.2)

## 2013-07-18 LAB — CBC WITH DIFFERENTIAL/PLATELET
Basophils Absolute: 0 10*3/uL (ref 0.0–0.1)
Eosinophils Absolute: 0.1 10*3/uL (ref 0.0–0.7)
Eosinophils Relative: 2 % (ref 0–5)
Hemoglobin: 15.7 g/dL — ABNORMAL HIGH (ref 12.0–15.0)
Lymphocytes Relative: 37 % (ref 12–46)
Lymphs Abs: 2.1 10*3/uL (ref 0.7–4.0)
MCV: 86.9 fL (ref 78.0–100.0)
Monocytes Relative: 11 % (ref 3–12)
RBC: 5.27 MIL/uL — ABNORMAL HIGH (ref 3.87–5.11)

## 2013-07-18 LAB — LIPID PANEL
Cholesterol: 200 mg/dL (ref 0–200)
LDL Cholesterol: 122 mg/dL — ABNORMAL HIGH (ref 0–99)
Total CHOL/HDL Ratio: 4.8 Ratio
Triglycerides: 178 mg/dL — ABNORMAL HIGH (ref <150)
VLDL: 36 mg/dL (ref 0–40)

## 2013-07-18 LAB — HEMOGLOBIN A1C: Mean Plasma Glucose: 151 mg/dL — ABNORMAL HIGH (ref <117)

## 2013-07-19 ENCOUNTER — Ambulatory Visit (INDEPENDENT_AMBULATORY_CARE_PROVIDER_SITE_OTHER): Payer: Medicare Other | Admitting: Internal Medicine

## 2013-07-19 ENCOUNTER — Encounter: Payer: Self-pay | Admitting: Internal Medicine

## 2013-07-19 VITALS — BP 150/96 | HR 60 | Temp 98.0°F | Ht 63.0 in | Wt 186.0 lb

## 2013-07-19 DIAGNOSIS — F411 Generalized anxiety disorder: Secondary | ICD-10-CM

## 2013-07-19 DIAGNOSIS — E8881 Metabolic syndrome: Secondary | ICD-10-CM

## 2013-07-19 DIAGNOSIS — E669 Obesity, unspecified: Secondary | ICD-10-CM

## 2013-07-19 DIAGNOSIS — K219 Gastro-esophageal reflux disease without esophagitis: Secondary | ICD-10-CM

## 2013-07-19 DIAGNOSIS — Z0000 Encounter for general adult medical examination without abnormal findings: Secondary | ICD-10-CM

## 2013-07-19 DIAGNOSIS — E119 Type 2 diabetes mellitus without complications: Secondary | ICD-10-CM

## 2013-07-19 DIAGNOSIS — E785 Hyperlipidemia, unspecified: Secondary | ICD-10-CM

## 2013-07-19 DIAGNOSIS — I10 Essential (primary) hypertension: Secondary | ICD-10-CM

## 2013-07-19 LAB — POCT URINALYSIS DIPSTICK
Bilirubin, UA: NEGATIVE
Protein, UA: NEGATIVE
Spec Grav, UA: 1.01

## 2013-07-19 MED ORDER — LISINOPRIL 10 MG PO TABS: 10.0000 mg | ORAL_TABLET | Freq: Every day | ORAL | Status: DC

## 2013-07-19 NOTE — Progress Notes (Signed)
Subjective:    Patient ID: Courtney Crosby, female    DOB: April 18, 1946, 67 y.o.   MRN: [id number]  HPI  67 year old White female with history of HTN, DM, hyperlipidemia, obesity in today for health maintenance exam. Patient says she does not tolerate Lipitor. Says she has tried other lipid-lowering medications in the past but cannot remember which ones. Explained to her that we need to get a better handle on her diabetic control, her blood pressure control and her lipid control. She's not exercising regularly. She has metabolic syndrome. She has history of dependent edema. She's been treated for precancerous lesions on her face and arms by dermatologist with fluorouracil.  She says she will receive influenza immunization October 21 at [facility]. Has yet to make appointment for mammogram but has been sent a requisition from this office in breast center has called her. She says she will do this at [facility]. She refuses Pneumovax immunization.  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 It is elevated today. She's only  on 5 mg lisinopril daily.  She has a remote history of melanoma 1991. History of dependent edema. History of anxiety. History of GE reflux.  History of herpes zoster left trunk 2006. Fractured right humerus 2007. History of appendectomy, history of bilateral ingrown toenail surgery, C-sections 1976 and 1978.  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 Husband is retired Nurse, adult. She has 2 adult children a son and a daughter. She is a nonsmoker. Does not consume alcohol.  Family history: Mother with history of diabetes mellitus and hypertension       Review of Systems  Constitutional: Positive for fatigue.  HENT: Negative.   Respiratory: Negative.   Cardiovascular: Negative.   Gastrointestinal:       GE reflux  Endocrine:       Controlled type 2 diabetes with diet   Genitourinary: Negative.   Allergic/Immunologic: Negative.   Neurological: Negative.   Hematological: Negative.   Psychiatric/Behavioral:       Minor anxiety related to family stress       Objective:   Physical Exam  Vitals reviewed. Constitutional: She is oriented to person, place, and time. She appears well-developed and well-nourished. No distress.  HENT:  Head: Normocephalic and atraumatic.  Right Ear: External ear normal.  Left Ear: External ear normal.  Mouth/Throat: Oropharynx is clear and moist. No oropharyngeal exudate.  Eyes: Conjunctivae and EOM are normal. Pupils are equal, round, and reactive to light. Right eye exhibits no discharge. Left eye exhibits no discharge. No scleral icterus.  Neck: Neck supple. No JVD present. No thyromegaly present.  Cardiovascular: Normal rate, regular rhythm, normal heart sounds and intact distal pulses.  Exam reveals no friction rub.   No murmur heard. Pulmonary/Chest: Effort normal and breath sounds normal. No respiratory distress. She has no rales. She exhibits no tenderness.  Breasts normal female but has nodule at 11:00 right breast which is firm  Abdominal: Soft. Bowel sounds are normal. She exhibits no distension and no mass. There is no tenderness. There is no rebound and no guarding.  Genitourinary:  Bimanual normal. Pap was done 2013  Musculoskeletal: Normal range of motion. She exhibits no edema.  Trace lower extremity edema. Diabetic foot exam is negative  Lymphadenopathy:    She has no cervical adenopathy.  Neurological: She is alert and oriented to person, place, and time. She has normal reflexes. No  cranial nerve deficit. Coordination normal.  Skin: Skin is warm and dry. No rash noted. She is not diaphoretic.  Psychiatric: She has a normal mood and affect. Her behavior is normal. Judgment and thought content normal.          Assessment & Plan:  Controlled type 2 diabetes. Her hemoglobin A1c has gone up to 6.9%. She  does not want be on diabetic medication. Advise diet and exercise. Will recheck in 3 months.  Hyperlipidemia-samples of Crestor 5 mg daily to be taken at supper given to patient. Recheck lipid panel liver functions in 3 months  Hypertension-increase lisinopril to 10 mg daily  Has nodule in right breast at 11:00. Recommend mammogram.  Obesity-recommend diet and exercise  Hyperlipidemia-start Crestor 5 mg daily and recheck in 3 months  Mild dependent edema-consider adding Diuril 8 if blood pressure does not respond to increasing lisinopril  History of GE reflux-not on prescription medication  History of anxiety-stable  Plan: See above and return in 3 months for nurse visit, blood pressure check, basic metabolic panel, A1c, lipid panel liver functions. Otherwise return in 6 months  Subjective:   Patient presents for Medicare Annual/Subsequent preventive examination.   Review Past Medical/Family/Social: see above   Risk Factors  Current exercise habits: sedentary Dietary issues discussed: yes  Cardiac risk factors:  Depression Screen  (Note: if answer to either of the following is "Yes", a more complete depression screening is indicated)   Over the past two weeks, have you felt down, depressed or hopeless? No  Over the past two weeks, have you felt little interest or pleasure in doing things? No Have you lost interest or pleasure in daily life? No Do you often feel hopeless? No Do you cry easily over simple problems? No   Activities of Daily Living  In your present state of health, do you have any difficulty performing the following activities?:   Driving? No  Managing money? No  Feeding yourself? No  Getting from bed to chair? No  Climbing a flight of stairs? No  Preparing food and eating?: No  Bathing or showering? No  Getting dressed: No  Getting to the toilet? No  Using the toilet:No  Moving around from place to place: No  In the past year have you fallen or had  a near fall?:No  Are you sexually active? No  Do you have more than one partner? No   Hearing Difficulties: No  Do you often ask people to speak up or repeat themselves? No  Do you experience ringing or noises in your ears? No  Do you have difficulty understanding soft or whispered voices? No  Do you feel that you have a problem with memory? No Do you often misplace items? No    Home Safety:  Do you have a smoke alarm at your residence? Yes Do you have grab bars in the bathroom?- Do you have throw rugs in your house?-   Cognitive Testing  Alert? Yes Normal Appearance?Yes  Oriented to person? Yes Place? Yes  Time? Yes  Recall of three objects? Yes  Can perform simple calculations? Yes  Displays appropriate judgment?Yes  Can read the correct time from a watch face?Yes   List the Names of Other Physician/Practitioners you currently use:  See referral list for the physicians patient is currently seeing. Dermatologist    Review of Systems:see above   Objective:     General appearance: Appears stated age and mildly obese  Head: Normocephalic, without obvious abnormality,  atraumatic  Eyes: conj clear, EOMi PEERLA  Ears: normal TM's and external ear canals both ears  Nose: Nares normal. Septum midline. Mucosa normal. No drainage or sinus tenderness.  Throat: lips, mucosa, and tongue normal; teeth and gums normal  Neck: no adenopathy, no carotid bruit, no JVD, supple, symmetrical, trachea midline and thyroid not enlarged, symmetric, no tenderness/mass/nodules  No CVA tenderness.  Lungs: clear to auscultation bilaterally  Breasts: normal appearance, no masses or tenderness. Nodule and 11:00 right breast Heart: regular rate and rhythm, S1, S2 normal, no murmur, click, rub or gallop  Abdomen: soft, non-tender; bowel sounds normal; no masses, no organomegaly  Musculoskeletal: ROM normal in all joints, no crepitus, no deformity, Normal muscle strengthen. Back  is symmetric, no  curvature. Skin: Skin color, texture, turgor normal. No rashes or lesions  Lymph nodes: Cervical, supraclavicular, and axillary nodes normal.  Neurologic: CN 2 -12 Normal, Normal symmetric reflexes. Normal coordination and gait  Psych: Alert & Oriented x 3, Mood appear stable.    Assessment:    Annual wellness medicare exam   Plan:    During the course of the visit the patient was educated and counseled about appropriate screening and preventive services including:   Annual mammogram Refuses pneumovax Annual flu vaccine     Patient Instructions (the written plan) was given to the patient.  Medicare Attestation  I have personally reviewed:  The patient's medical and social history  Their use of alcohol, tobacco or illicit drugs  Their current medications and supplements  The patient's functional ability including ADLs,fall risks, home safety risks, cognitive, and hearing and visual impairment  Diet and physical activities  Evidence for depression or mood disorders  The patient's weight, height, BMI, and visual acuity have been recorded in the chart. I have made referrals, counseling, and provided education to the patient based on review of the above and I have provided the patient with a written personalized care plan for preventive services.

## 2013-07-19 NOTE — Addendum Note (Signed)
Addended by: Fayne Mediate on: 07/19/2013 11:45 AM   Modules accepted: Orders

## 2013-07-19 NOTE — Patient Instructions (Signed)
Start Crestor 5 mg daily. Increase lisinopril to 10 mg daily. Return in 3 months for basic metabolic panel, TSH, lipid panel liver functions and blood pressure check (nurse visit) otherwise return in 6 months. Please have mammogram.

## 2013-08-07 ENCOUNTER — Ambulatory Visit (HOSPITAL_COMMUNITY)
Admission: RE | Admit: 2013-08-07 | Discharge: 2013-08-07 | Disposition: A | Discharge status: Home / Self Care | Payer: Medicare Other | Source: Ambulatory Visit | Attending: Internal Medicine | Admitting: Internal Medicine

## 2013-08-07 DIAGNOSIS — Z780 Asymptomatic menopausal state: Secondary | ICD-10-CM | POA: Insufficient documentation

## 2013-08-07 DIAGNOSIS — Z13820 Encounter for screening for osteoporosis: Secondary | ICD-10-CM | POA: Insufficient documentation

## 2013-08-07 DIAGNOSIS — Z1231 Encounter for screening mammogram for malignant neoplasm of breast: Secondary | ICD-10-CM | POA: Insufficient documentation

## 2013-10-01 ENCOUNTER — Other Ambulatory Visit: Payer: Self-pay | Admitting: Internal Medicine

## 2013-10-21 ENCOUNTER — Other Ambulatory Visit: Payer: Medicare Other | Admitting: Internal Medicine

## 2014-01-02 ENCOUNTER — Other Ambulatory Visit: Payer: Medicare Other | Admitting: Internal Medicine

## 2014-01-02 DIAGNOSIS — E785 Hyperlipidemia, unspecified: Secondary | ICD-10-CM

## 2014-01-02 DIAGNOSIS — E119 Type 2 diabetes mellitus without complications: Secondary | ICD-10-CM

## 2014-01-02 DIAGNOSIS — I10 Essential (primary) hypertension: Secondary | ICD-10-CM

## 2014-01-02 DIAGNOSIS — Z79899 Other long term (current) drug therapy: Secondary | ICD-10-CM

## 2014-01-02 LAB — HEMOGLOBIN A1C
Hgb A1c MFr Bld: 6.9 % — ABNORMAL HIGH (ref <5.7)
Mean Plasma Glucose: 151 mg/dL — ABNORMAL HIGH (ref <117)

## 2014-01-02 LAB — BASIC METABOLIC PANEL
BUN: 10 mg/dL (ref 6–23)
CALCIUM: 9.1 mg/dL (ref 8.4–10.5)
CO2: 25 mEq/L (ref 19–32)
Chloride: 105 mEq/L (ref 96–112)
Creat: 0.78 mg/dL (ref 0.50–1.10)
Glucose, Bld: 128 mg/dL — ABNORMAL HIGH (ref 70–99)
POTASSIUM: 4.4 meq/L (ref 3.5–5.3)
SODIUM: 139 meq/L (ref 135–145)

## 2014-01-02 LAB — HEPATIC FUNCTION PANEL
ALBUMIN: 3.9 g/dL (ref 3.5–5.2)
ALK PHOS: 51 U/L (ref 39–117)
ALT: 29 U/L (ref 0–35)
AST: 26 U/L (ref 0–37)
Bilirubin, Direct: 0.1 mg/dL (ref 0.0–0.3)
Indirect Bilirubin: 0.5 mg/dL (ref 0.2–1.2)
TOTAL PROTEIN: 6.4 g/dL (ref 6.0–8.3)
Total Bilirubin: 0.6 mg/dL (ref 0.2–1.2)

## 2014-01-02 LAB — LIPID PANEL
CHOLESTEROL: 192 mg/dL (ref 0–200)
HDL: 42 mg/dL (ref >39)
LDL Cholesterol: 120 mg/dL — ABNORMAL HIGH (ref 0–99)
Total CHOL/HDL Ratio: 4.6 Ratio
Triglycerides: 149 mg/dL (ref <150)
VLDL: 30 mg/dL (ref 0–40)

## 2014-01-14 ENCOUNTER — Ambulatory Visit (INDEPENDENT_AMBULATORY_CARE_PROVIDER_SITE_OTHER): Payer: Medicare Other | Admitting: Internal Medicine

## 2014-01-14 ENCOUNTER — Encounter: Payer: Self-pay | Admitting: Internal Medicine

## 2014-01-14 VITALS — BP 120/78 | HR 80 | Ht 63.0 in | Wt 186.0 lb

## 2014-01-14 DIAGNOSIS — E119 Type 2 diabetes mellitus without complications: Secondary | ICD-10-CM

## 2014-01-14 DIAGNOSIS — E8881 Metabolic syndrome: Secondary | ICD-10-CM

## 2014-01-14 DIAGNOSIS — E785 Hyperlipidemia, unspecified: Secondary | ICD-10-CM

## 2014-01-14 DIAGNOSIS — F411 Generalized anxiety disorder: Secondary | ICD-10-CM

## 2014-01-14 DIAGNOSIS — E669 Obesity, unspecified: Secondary | ICD-10-CM

## 2014-01-14 DIAGNOSIS — I10 Essential (primary) hypertension: Secondary | ICD-10-CM

## 2014-01-14 NOTE — Progress Notes (Signed)
   Subjective:    Patient ID: Courtney Crosby, female    DOB: 12/30/1945, 68 y.o.   MRN: [id number]  HPI  68 year old white female with history of diabetes mellitus that is diet controlled, hypertension treated with lisinopril, hyperlipidemia not on medication because she is statin intolerant, obesity, metabolic syndrome, in today for six-month recheck. Has been trying to diet exercise and lose weight but has not been successful in losing weight over the past 6 months. Has watched sweet consumption. Hemoglobin A1c stable at 7.7%. LDL stable at 120 on statin medication. Doesn't want to be on Zetia either. Anxiety has improved. Husband has had some issues with feeling woozy and lightheaded. No etiology has been found and that is a bit worrisome to her.  She recently had diabetic eye exam with Dr. Delman Cheadle in the last 2 weeks. See note in Epic that has been scanned.    Review of Systems     Objective:   Physical Exam  Skin warm and dry. Chest clear to auscultation. Cardiac exam regular rate and rhythm normal S1 and S2. Extremities without edema. Pulses are normal in feet. Diabetic foot exam without ulcers or calluses      Assessment & Plan:  Type 2 diabetes mellitus-controlled with diet  Hyperlipidemia-not on medication. Statin intolerant. LDL stable at 120  Anxiety-stable with sparing doses of Xanax  Hypertension-stable on lisinopril  Obesity-will continue to work on diet and exercise. Check TSH to rule out hypothyroidism.  Metabolic syndrome  Plan: Return in 6 months for physical examination.

## 2014-01-14 NOTE — Patient Instructions (Signed)
Continue work on diet and exercise. Return in 6 months. Continue same medications. TSH will be checked

## 2014-04-14 ENCOUNTER — Ambulatory Visit (INDEPENDENT_AMBULATORY_CARE_PROVIDER_SITE_OTHER): Payer: Medicare Other | Admitting: Internal Medicine

## 2014-04-14 ENCOUNTER — Encounter: Payer: Self-pay | Admitting: Internal Medicine

## 2014-04-14 ENCOUNTER — Ambulatory Visit
Admission: RE | Admit: 2014-04-14 | Discharge: 2014-04-14 | Disposition: A | Discharge status: Home / Self Care | Payer: Medicare Other | Source: Ambulatory Visit | Attending: Internal Medicine | Admitting: Internal Medicine

## 2014-04-14 VITALS — BP 144/90 | HR 76 | Temp 98.1°F | Wt 184.0 lb

## 2014-04-14 DIAGNOSIS — M5432 Sciatica, left side: Secondary | ICD-10-CM

## 2014-04-14 DIAGNOSIS — M5430 Sciatica, unspecified side: Secondary | ICD-10-CM

## 2014-04-14 DIAGNOSIS — M25559 Pain in unspecified hip: Secondary | ICD-10-CM

## 2014-04-14 DIAGNOSIS — M25552 Pain in left hip: Secondary | ICD-10-CM

## 2014-04-14 MED ORDER — CYCLOBENZAPRINE HCL 10 MG PO TABS: 10.0000 mg | ORAL_TABLET | Freq: Every day | ORAL | Status: DC

## 2014-04-14 NOTE — Patient Instructions (Signed)
Take Flexeril at bedtime. Order given for physical therapy for 2-4 weeks. Take Aleve. Have x-ray of left hip.
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
## 2014-04-14 NOTE — Progress Notes (Signed)
Patient informed. 

## 2014-07-21 ENCOUNTER — Other Ambulatory Visit: Payer: Medicare Other | Admitting: Internal Medicine

## 2014-07-21 DIAGNOSIS — Z0000 Encounter for general adult medical examination without abnormal findings: Secondary | ICD-10-CM

## 2014-07-21 DIAGNOSIS — I10 Essential (primary) hypertension: Secondary | ICD-10-CM

## 2014-07-21 DIAGNOSIS — E119 Type 2 diabetes mellitus without complications: Secondary | ICD-10-CM

## 2014-07-21 DIAGNOSIS — E7800 Pure hypercholesterolemia, unspecified: Secondary | ICD-10-CM

## 2014-07-21 NOTE — Addendum Note (Signed)
Addended by: Amado Coe on: 07/21/2014 09:47 AM   Modules accepted: Orders

## 2014-07-22 ENCOUNTER — Ambulatory Visit (INDEPENDENT_AMBULATORY_CARE_PROVIDER_SITE_OTHER): Payer: Medicare Other | Admitting: Internal Medicine

## 2014-07-22 ENCOUNTER — Encounter: Payer: Self-pay | Admitting: Internal Medicine

## 2014-07-22 VITALS — BP 130/88 | HR 78 | Temp 96.9°F | Resp 16 | Ht 63.5 in | Wt 184.0 lb

## 2014-07-22 DIAGNOSIS — E669 Obesity, unspecified: Secondary | ICD-10-CM

## 2014-07-22 DIAGNOSIS — Z0000 Encounter for general adult medical examination without abnormal findings: Secondary | ICD-10-CM

## 2014-07-22 DIAGNOSIS — Z8659 Personal history of other mental and behavioral disorders: Secondary | ICD-10-CM

## 2014-07-22 DIAGNOSIS — E8881 Metabolic syndrome: Secondary | ICD-10-CM

## 2014-07-22 DIAGNOSIS — E119 Type 2 diabetes mellitus without complications: Secondary | ICD-10-CM

## 2014-07-22 DIAGNOSIS — I10 Essential (primary) hypertension: Secondary | ICD-10-CM

## 2014-07-22 DIAGNOSIS — E785 Hyperlipidemia, unspecified: Secondary | ICD-10-CM

## 2014-07-22 LAB — POCT URINALYSIS DIPSTICK
Bilirubin, UA: NEGATIVE
Blood, UA: NEGATIVE
GLUCOSE UA: NEGATIVE
KETONES UA: NEGATIVE
Leukocytes, UA: NEGATIVE
Nitrite, UA: NEGATIVE
Protein, UA: NEGATIVE
SPEC GRAV UA: 1.015
Urobilinogen, UA: NEGATIVE
pH, UA: 6

## 2014-07-22 LAB — CBC WITH DIFFERENTIAL/PLATELET
BASOS ABS: 0 10*3/uL (ref 0.0–0.1)
BASOS PCT: 0 % (ref 0–1)
EOS ABS: 0.1 10*3/uL (ref 0.0–0.7)
EOS PCT: 2 % (ref 0–5)
HCT: 46.3 % — ABNORMAL HIGH (ref 36.0–46.0)
Hemoglobin: 15.6 g/dL — ABNORMAL HIGH (ref 12.0–15.0)
Lymphocytes Relative: 36 % (ref 12–46)
Lymphs Abs: 2.5 10*3/uL (ref 0.7–4.0)
MCH: 29.9 pg (ref 26.0–34.0)
MCHC: 33.7 g/dL (ref 30.0–36.0)
MCV: 88.9 fL (ref 78.0–100.0)
Monocytes Absolute: 0.7 10*3/uL (ref 0.1–1.0)
Monocytes Relative: 10 % (ref 3–12)
Neutro Abs: 3.6 10*3/uL (ref 1.7–7.7)
Neutrophils Relative %: 52 % (ref 43–77)
Platelets: 236 10*3/uL (ref 150–400)
RBC: 5.21 MIL/uL — ABNORMAL HIGH (ref 3.87–5.11)
RDW: 13.7 % (ref 11.5–15.5)
WBC: 6.9 10*3/uL (ref 4.0–10.5)

## 2014-07-22 LAB — LIPID PANEL
CHOL/HDL RATIO: 4.1 ratio
CHOLESTEROL: 194 mg/dL (ref 0–200)
HDL: 47 mg/dL (ref >39)
LDL Cholesterol: 115 mg/dL — ABNORMAL HIGH (ref 0–99)
TRIGLYCERIDES: 162 mg/dL — AB (ref <150)
VLDL: 32 mg/dL (ref 0–40)

## 2014-07-22 LAB — COMPREHENSIVE METABOLIC PANEL
ALK PHOS: 54 U/L (ref 39–117)
ALT: 28 U/L (ref 0–35)
AST: 25 U/L (ref 0–37)
Albumin: 4.1 g/dL (ref 3.5–5.2)
BILIRUBIN TOTAL: 0.6 mg/dL (ref 0.2–1.2)
BUN: 9 mg/dL (ref 6–23)
CO2: 23 meq/L (ref 19–32)
CREATININE: 0.74 mg/dL (ref 0.50–1.10)
Calcium: 9.3 mg/dL (ref 8.4–10.5)
Chloride: 106 mEq/L (ref 96–112)
Glucose, Bld: 125 mg/dL — ABNORMAL HIGH (ref 70–99)
Potassium: 4.4 mEq/L (ref 3.5–5.3)
SODIUM: 140 meq/L (ref 135–145)
TOTAL PROTEIN: 6.7 g/dL (ref 6.0–8.3)

## 2014-07-22 LAB — HEMOGLOBIN A1C
Hgb A1c MFr Bld: 6.9 % — ABNORMAL HIGH (ref <5.7)
Mean Plasma Glucose: 151 mg/dL — ABNORMAL HIGH (ref <117)

## 2014-07-22 LAB — TSH: TSH: 1.086 u[IU]/mL (ref 0.350–4.500)

## 2014-07-23 LAB — MICROALBUMIN, URINE: MICROALB UR: 1.1 mg/dL (ref <2.0)

## 2014-08-18 ENCOUNTER — Other Ambulatory Visit: Payer: Self-pay

## 2014-08-18 MED ORDER — LISINOPRIL 10 MG PO TABS: 10.0000 mg | ORAL_TABLET | Freq: Every day | ORAL | Status: DC

## 2014-09-07 NOTE — Progress Notes (Signed)
Subjective:    Patient ID: Courtney Crosby, female    DOB: 03-Mar-1946, 68 y.o.   MRN: [id number]  HPI 68 year old White Female in today for health maintenance exam and evaluation of medical issues. Patient has a history of controlled type 2 diabetes, hyperlipidemia, obesity, hypertension, metabolic syndrome, GE reflux, anxiety.  Patient says she does not tolerate Lipitor and is tried other lipid lowering medications but cannot remember which ones. She does not exercise regularly. She has a history of dependent edema. She has been treated for precancerous lesions on her face and arms by dermatologist with fluoroscopy urocele.  Remote history of melanoma 1991. History of herpes zoster left trunk 2006. Fractured right humerus 2007. History of appendectomy. History of ingrown toenail surgery bilaterally. Cecile insect is 1976 and 1978.  She refuses Pneumovax immunization.  Reminded regarding annual mammogram.  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 Husband is a retired Higher education careers adviser. She has 2 adult children, son and a daughter. She is a nonsmoker. Does not consume alcohol.  Family history: Mother with history of diabetes mellitus and hypertension    Review of Systems  HENT: Negative.   Respiratory: Negative.   Cardiovascular: Negative.   Gastrointestinal:       History of GE reflux  Psychiatric/Behavioral:       History of anxiety related to family issues       Objective:   Physical Exam  Constitutional: She is oriented to person, place, and time. She appears well-developed and well-nourished. No distress.  HENT:  Head: Normocephalic and atraumatic.  Right Ear: External ear normal.  Left Ear: External ear normal.  Mouth/Throat: Oropharynx is clear and moist. No oropharyngeal exudate.  Eyes: Conjunctivae and EOM are normal. Pupils are equal, round, and reactive to light. Right eye exhibits no discharge. Left eye exhibits no  discharge.  Neck: Neck supple. No JVD present. No thyromegaly present.  Cardiovascular: Normal rate, regular rhythm, normal heart sounds and intact distal pulses.   No murmur heard. Pulmonary/Chest: Effort normal and breath sounds normal. No respiratory distress. She has no wheezes. She has no rales.  Abdominal: Soft. Bowel sounds are normal. She exhibits no distension and no mass. There is no tenderness. There is no rebound and no guarding.  Genitourinary:  Bimanual normal. Pap done 2013.  Musculoskeletal: Normal range of motion. She exhibits no edema.  Lymphadenopathy:    She has no cervical adenopathy.  Neurological: She is alert and oriented to person, place, and time. No cranial nerve deficit. Coordination normal.  Skin: Skin is warm and dry. No rash noted. She is not diaphoretic.  Psychiatric: She has a normal mood and affect. Her behavior is normal. Judgment and thought content normal.  Vitals reviewed.         Assessment & Plan:  GE reflux-stable on PPI  History of anxiety-stable  Obesity-once again discussion regarding diet and exercise  Metabolic syndrome  Type 2 diabetes mellitus-stable. Ingrown A1c 6.9%  Hypertension-stable  Hyperlipidemia-does not tolerate statin medication. Triglycerides 162. LDL cholesterol 1115  Plan: Return in 6 months or as needed. Reiterate importance of diet exercise and weight loss.  Subjective:   Patient presents for Medicare Annual/Subsequent preventive examination.  Review Past Medical/Family/Social:   Risk Factors  Current exercise habits:  Dietary issues discussed:   Cardiac risk factors:  Depression Screen  (Note: if answer to either of the following is "Yes", a more complete depression screening is indicated)   Over  the past two weeks, have you felt down, depressed or hopeless? No  Over the past two weeks, have you felt little interest or pleasure in doing things? No Have you lost interest or pleasure in daily life?  No Do you often feel hopeless? No Do you cry easily over simple problems? No   Activities of Daily Living  In your present state of health, do you have any difficulty performing the following activities?:   Driving? No  Managing money? No  Feeding yourself? No  Getting from bed to chair? No  Climbing a flight of stairs? No  Preparing food and eating?: No  Bathing or showering? No  Getting dressed: No  Getting to the toilet? No  Using the toilet:No  Moving around from place to place: No  In the past year have you fallen or had a near fall?:No  Are you sexually active? No  Do you have more than one partner? No   Hearing Difficulties: No  Do you often ask people to speak up or repeat themselves? No  Do you experience ringing or noises in your ears? No  Do you have difficulty understanding soft or whispered voices? No  Do you feel that you have a problem with memory? No Do you often misplace items? No    Home Safety:  Do you have a smoke alarm at your residence? Yes Do you have grab bars in the bathroom?- Do you have throw rugs in your house?-   Cognitive Testing  Alert? Yes Normal Appearance?Yes  Oriented to person? Yes Place? Yes  Time? Yes  Recall of three objects? Yes  Can perform simple calculations? Yes  Displays appropriate judgment?Yes  Can read the correct time from a watch face?Yes   List the Names of Other Physician/Practitioners you currently use:  See referral list for the physicians patient is currently seeing.  Dermatologist   Review of Systems: See above   Objective:     General appearance: Appears stated age and mildly obese  Head: Normocephalic, without obvious abnormality, atraumatic  Eyes: conj clear, EOMi PEERLA  Ears: normal TM's and external ear canals both ears  Nose: Nares normal. Septum midline. Mucosa normal. No drainage or sinus tenderness.  Throat: lips, mucosa, and tongue normal; teeth and gums normal  Neck: no adenopathy, no  carotid bruit, no JVD, supple, symmetrical, trachea midline and thyroid not enlarged, symmetric, no tenderness/mass/nodules  No CVA tenderness.  Lungs: clear to auscultation bilaterally  Breasts: normal appearance, no masses or tenderness Heart: regular rate and rhythm, S1, S2 normal, no murmur, click, rub or gallop  Abdomen: soft, non-tender; bowel sounds normal; no masses, no organomegaly  Musculoskeletal: ROM normal in all joints, no crepitus, no deformity, Normal muscle strengthen. Back  is symmetric, no curvature. Skin: Skin color, texture, turgor normal. No rashes or lesions  Lymph nodes: Cervical, supraclavicular, and axillary nodes normal.  Neurologic: CN 2 -12 Normal, Normal symmetric reflexes. Normal coordination and gait  Psych: Alert & Oriented x 3, Mood appear stable.    Assessment:    Annual wellness medicare exam   Plan:    During the course of the visit the patient was educated and counseled about appropriate screening and preventive services including:   Recommend annual mammogram  Declines pneumonia vaccine  Recommend annual eye exam     Patient Instructions (the written plan) was given to the patient.  Medicare Attestation  I have personally reviewed:  The patient's medical and social history  Their use of  alcohol, tobacco or illicit drugs  Their current medications and supplements  The patient's functional ability including ADLs,fall risks, home safety risks, cognitive, and hearing and visual impairment  Diet and physical activities  Evidence for depression or mood disorders  The patient's weight, height, BMI, and visual acuity have been recorded in the chart. I have made referrals, counseling, and provided education to the patient based on review of the above and I have provided the patient with a written personalized care plan for preventive services.

## 2014-09-07 NOTE — Patient Instructions (Addendum)
Follow-up in 6 months. Continue same medications. Encouraged diet exercise and weight loss.

## 2014-09-15 ENCOUNTER — Other Ambulatory Visit: Payer: Self-pay | Admitting: Internal Medicine

## 2014-09-15 DIAGNOSIS — Z1231 Encounter for screening mammogram for malignant neoplasm of breast: Secondary | ICD-10-CM

## 2014-09-24 ENCOUNTER — Ambulatory Visit (HOSPITAL_COMMUNITY)
Admission: RE | Admit: 2014-09-24 | Discharge: 2014-09-24 | Disposition: A | Discharge status: Home / Self Care | Payer: Medicare Other | Source: Ambulatory Visit | Attending: Internal Medicine | Admitting: Internal Medicine

## 2014-09-24 DIAGNOSIS — Z1231 Encounter for screening mammogram for malignant neoplasm of breast: Secondary | ICD-10-CM | POA: Diagnosis not present

## 2014-12-09 ENCOUNTER — Other Ambulatory Visit: Payer: Self-pay | Admitting: Internal Medicine

## 2014-12-12 LAB — HM DIABETES EYE EXAM

## 2015-02-24 ENCOUNTER — Other Ambulatory Visit: Payer: Self-pay | Admitting: Internal Medicine

## 2015-06-05 ENCOUNTER — Encounter: Payer: Self-pay | Admitting: Internal Medicine

## 2015-07-27 ENCOUNTER — Other Ambulatory Visit: Payer: Medicare Other | Admitting: Internal Medicine

## 2015-07-27 ENCOUNTER — Other Ambulatory Visit: Payer: Self-pay | Admitting: Internal Medicine

## 2015-07-27 DIAGNOSIS — E119 Type 2 diabetes mellitus without complications: Secondary | ICD-10-CM

## 2015-07-27 DIAGNOSIS — I10 Essential (primary) hypertension: Secondary | ICD-10-CM

## 2015-07-27 DIAGNOSIS — E7800 Pure hypercholesterolemia, unspecified: Secondary | ICD-10-CM

## 2015-07-27 DIAGNOSIS — Z0000 Encounter for general adult medical examination without abnormal findings: Secondary | ICD-10-CM

## 2015-07-27 LAB — CBC WITH DIFFERENTIAL/PLATELET
BASOS ABS: 0.1 10*3/uL (ref 0.0–0.1)
Basophils Relative: 1 % (ref 0–1)
EOS ABS: 0.1 10*3/uL (ref 0.0–0.7)
EOS PCT: 2 % (ref 0–5)
HEMATOCRIT: 47.7 % — AB (ref 36.0–46.0)
Hemoglobin: 16 g/dL — ABNORMAL HIGH (ref 12.0–15.0)
LYMPHS PCT: 39 % (ref 12–46)
Lymphs Abs: 2.5 10*3/uL (ref 0.7–4.0)
MCH: 30.3 pg (ref 26.0–34.0)
MCHC: 33.5 g/dL (ref 30.0–36.0)
MCV: 90.3 fL (ref 78.0–100.0)
MONO ABS: 0.6 10*3/uL (ref 0.1–1.0)
MPV: 11.9 fL (ref 8.6–12.4)
Monocytes Relative: 10 % (ref 3–12)
Neutro Abs: 3.1 10*3/uL (ref 1.7–7.7)
Neutrophils Relative %: 48 % (ref 43–77)
PLATELETS: 222 10*3/uL (ref 150–400)
RBC: 5.28 MIL/uL — AB (ref 3.87–5.11)
RDW: 13.5 % (ref 11.5–15.5)
WBC: 6.4 10*3/uL (ref 4.0–10.5)

## 2015-07-27 LAB — COMPLETE METABOLIC PANEL WITH GFR
ALT: 34 U/L — AB (ref 6–29)
AST: 30 U/L (ref 10–35)
Albumin: 4 g/dL (ref 3.6–5.1)
Alkaline Phosphatase: 48 U/L (ref 33–130)
BUN: 10 mg/dL (ref 7–25)
CALCIUM: 9.6 mg/dL (ref 8.6–10.4)
CHLORIDE: 103 mmol/L (ref 98–110)
CO2: 28 mmol/L (ref 20–31)
Creat: 0.83 mg/dL (ref 0.60–0.93)
GFR, EST AFRICAN AMERICAN: 83 mL/min (ref >=60)
GFR, Est Non African American: 72 mL/min (ref >=60)
Glucose, Bld: 151 mg/dL — ABNORMAL HIGH (ref 65–99)
POTASSIUM: 4.3 mmol/L (ref 3.5–5.3)
Sodium: 140 mmol/L (ref 135–146)
Total Bilirubin: 0.9 mg/dL (ref 0.2–1.2)
Total Protein: 7 g/dL (ref 6.1–8.1)

## 2015-07-27 LAB — LIPID PANEL
CHOL/HDL RATIO: 5.9 ratio — AB (ref <=5.0)
CHOLESTEROL: 226 mg/dL — AB (ref 125–200)
HDL: 38 mg/dL — ABNORMAL LOW (ref >=46)
LDL CALC: 148 mg/dL — AB (ref <130)
TRIGLYCERIDES: 201 mg/dL — AB (ref <150)
VLDL: 40 mg/dL — AB (ref <30)

## 2015-07-27 LAB — TSH: TSH: 1.33 u[IU]/mL (ref 0.350–4.500)

## 2015-07-28 ENCOUNTER — Ambulatory Visit (INDEPENDENT_AMBULATORY_CARE_PROVIDER_SITE_OTHER): Payer: Medicare Other | Admitting: Internal Medicine

## 2015-07-28 ENCOUNTER — Telehealth: Payer: Self-pay

## 2015-07-28 ENCOUNTER — Encounter: Payer: Self-pay | Admitting: Internal Medicine

## 2015-07-28 VITALS — BP 138/90 | HR 91 | Temp 98.3°F | Ht 63.75 in | Wt 185.0 lb

## 2015-07-28 DIAGNOSIS — Z0000 Encounter for general adult medical examination without abnormal findings: Secondary | ICD-10-CM | POA: Diagnosis not present

## 2015-07-28 DIAGNOSIS — E8881 Metabolic syndrome: Secondary | ICD-10-CM

## 2015-07-28 DIAGNOSIS — Z789 Other specified health status: Secondary | ICD-10-CM

## 2015-07-28 DIAGNOSIS — I10 Essential (primary) hypertension: Secondary | ICD-10-CM | POA: Diagnosis not present

## 2015-07-28 DIAGNOSIS — E669 Obesity, unspecified: Secondary | ICD-10-CM | POA: Diagnosis not present

## 2015-07-28 DIAGNOSIS — Z889 Allergy status to unspecified drugs, medicaments and biological substances status: Secondary | ICD-10-CM | POA: Diagnosis not present

## 2015-07-28 DIAGNOSIS — F411 Generalized anxiety disorder: Secondary | ICD-10-CM | POA: Diagnosis not present

## 2015-07-28 DIAGNOSIS — E785 Hyperlipidemia, unspecified: Secondary | ICD-10-CM | POA: Diagnosis not present

## 2015-07-28 DIAGNOSIS — E119 Type 2 diabetes mellitus without complications: Secondary | ICD-10-CM | POA: Diagnosis not present

## 2015-07-28 DIAGNOSIS — K219 Gastro-esophageal reflux disease without esophagitis: Secondary | ICD-10-CM

## 2015-07-28 LAB — POCT URINALYSIS DIPSTICK
BILIRUBIN UA: NEGATIVE
GLUCOSE UA: NEGATIVE
Ketones, UA: NEGATIVE
Leukocytes, UA: NEGATIVE
Nitrite, UA: NEGATIVE
Protein, UA: NEGATIVE
RBC UA: NEGATIVE
SPEC GRAV UA: 1.02
UROBILINOGEN UA: NEGATIVE
pH, UA: 6

## 2015-07-28 LAB — HEMOGLOBIN A1C
Hgb A1c MFr Bld: 7.6 % — ABNORMAL HIGH (ref <5.7)
Mean Plasma Glucose: 171 mg/dL — ABNORMAL HIGH (ref <117)

## 2015-07-28 MED ORDER — EZETIMIBE 10 MG PO TABS
10.0000 mg | ORAL_TABLET | Freq: Every day | ORAL | Status: DC
Start: 2015-07-28 — End: 2016-03-08

## 2015-07-28 NOTE — Telephone Encounter (Signed)
Per Dr Renold Genta A1c needs to be added to lab work.  Called [facility] and added order using elevated glucose serum as diagnosis.

## 2015-07-28 NOTE — Patient Instructions (Signed)
Try Zetia 10 mg daily. Must lose weight, diet and exercise. RTC in 3 months

## 2015-07-28 NOTE — Progress Notes (Signed)
Subjective:    Patient ID: Courtney Crosby, female    DOB: 10/02/1946, 69 y.o.   MRN: [id number]  HPI 69 year old female in today for health maintenance exam and evaluation of medical issues. Refuses flu and Prevnar vaccines. History of type 2 diabetes mellitus, hyperlipidemia, essential hypertension, obesity, anxiety, metabolic syndrome.  Patient says she does not tolerate Lipitor and has tried other lipid-lowering medications but cannot remember which ones. She does not exercise regularly. She has a history of dependent edema. Has been treated for precancerous lesions on her face and arms by dermatologist.  History of GE reflux. Remote history of melanoma 1991. Herpes zoster left trunk 2006. Fractured right humerus 2007. History of appendectomy. History of ingrown toenail surgically removed bilaterally. Cesarean sections 1976 and 1978.  Patient reminded about annual mammogram.  Family history: Mother with history of diabetes mellitus and hypertension.  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 2 adult children, son and a daughter. Does not consume alcohol. Nonsmoker.    Review of Systems history of GE reflux. History of anxiety related to family issues.     Objective:   Physical Exam  Constitutional: She is oriented to person, place, and time. She appears well-developed and well-nourished. No distress.  HENT:  Head: Normocephalic and atraumatic.  Right Ear: External ear normal.  Left Ear: External ear normal.  Mouth/Throat: Oropharynx is clear and moist. No oropharyngeal exudate.  Eyes: Conjunctivae and EOM are normal. Pupils are equal, round, and reactive to light. Right eye exhibits no discharge. Left eye exhibits no discharge.  Neck: Neck supple. No JVD present. No thyromegaly present.  Cardiovascular: Normal rate and regular rhythm.   No murmur heard. Pulmonary/Chest: Effort normal and breath  sounds normal. No respiratory distress. She has no rales. She exhibits no tenderness.  Breasts normal female without masses  Abdominal: Soft. Bowel sounds are normal. She exhibits no distension and no mass. There is no rebound and no guarding.  Genitourinary:  Pap done 2013. Not repeated due to age. Bimanual normal.  Musculoskeletal: She exhibits no edema.  Lymphadenopathy:    She has no cervical adenopathy.  Neurological: She is alert and oriented to person, place, and time. She has normal reflexes. No cranial nerve deficit.  Skin: Skin is warm and dry. No rash noted. She is not diaphoretic.  Psychiatric: She has a normal mood and affect. Her behavior is normal. Judgment and thought content normal.  Vitals reviewed.         Assessment & Plan:  Controlled type 2 diabetes mellitus without insulin-hemoglobin A1c increased to 6.9%. Control can be better. Still think she's been trying very hard with diet and exercise.  Anxiety  GE reflux-stable on PPI  Metabolic syndrome  Obesity  Hypertension  Hyperlipidemia-does not tolerate statin therapy. Cholesterol has worsened. Total cholesterol increased from 194-226. Triglycerides increased from 162-201. LDL cholesterol increased from 115 to 148. Try Zetia since she is statin intolerant. Return in 3 months for follow-up. Get serious about diet and exercise.  Plan: Zetia 10 mg daily. Encouraged diet exercise and weight loss. Return in 3 months.  Subjective:   Patient presents for Medicare Annual/Subsequent preventive examination.  Review Past Medical/Family/Social: See above   Risk Factors  Current exercise habits: Sedentary Dietary issues discussed: Low fat low carb  Cardiac risk factors: Metabolic syndrome, hyperlipidemia, diabetes mellitus, family history of diabetes in mother  Depression Screen  (Note: if answer to  either of the following is "Yes", a more complete depression screening is indicated)   Over the past two weeks,  have you felt down, depressed or hopeless? No  Over the past two weeks, have you felt little interest or pleasure in doing things? No Have you lost interest or pleasure in daily life? No Do you often feel hopeless? No Do you cry easily over simple problems? No   Activities of Daily Living  In your present state of health, do you have any difficulty performing the following activities?:   Driving? No  Managing money? No  Feeding yourself? No  Getting from bed to chair? No  Climbing a flight of stairs? No  Preparing food and eating?: No  Bathing or showering? No  Getting dressed: No  Getting to the toilet? No  Using the toilet:No  Moving around from place to place: No  In the past year have you fallen or had a near fall?:No  Are you sexually active? No  Do you have more than one partner? No   Hearing Difficulties: No  Do you often ask people to speak up or repeat themselves? No  Do you experience ringing or noises in your ears? No  Do you have difficulty understanding soft or whispered voices? No  Do you feel that you have a problem with memory? No Do you often misplace items? No    Home Safety:  Do you have a smoke alarm at your residence? Yes Do you have grab bars in the bathroom? Yes Do you have throw rugs in your house? No   Cognitive Testing  Alert? Yes Normal Appearance?Yes  Oriented to person? Yes Place? Yes  Time? Yes  Recall of three objects? Yes  Can perform simple calculations? Yes  Displays appropriate judgment?Yes  Can read the correct time from a watch face?Yes   List the Names of Other Physician/Practitioners you currently use:  See referral list for the physicians patient is currently seeing.  Dermatologist  Reminded about annual diabetic eye exam   Review of Systems: See above   Objective:     General appearance: Appears stated age and obese  Head: Normocephalic, without obvious abnormality, atraumatic  Eyes: conj clear, EOMi PEERLA  Ears:  normal TM's and external ear canals both ears  Nose: Nares normal. Septum midline. Mucosa normal. No drainage or sinus tenderness.  Throat: lips, mucosa, and tongue normal; teeth and gums normal  Neck: no adenopathy, no carotid bruit, no JVD, supple, symmetrical, trachea midline and thyroid not enlarged, symmetric, no tenderness/mass/nodules  No CVA tenderness.  Lungs: clear to auscultation bilaterally  Breasts: normal appearance, no masses or tenderness Heart: regular rate and rhythm, S1, S2 normal, no murmur, click, rub or gallop  Abdomen: soft, non-tender; bowel sounds normal; no masses, no organomegaly  Musculoskeletal: ROM normal in all joints, no crepitus, no deformity, Normal muscle strengthen. Back  is symmetric, no curvature. Skin: Skin color, texture, turgor normal. No rashes or lesions  Lymph nodes: Cervical, supraclavicular, and axillary nodes normal.  Neurologic: CN 2 -12 Normal, Normal symmetric reflexes. Normal coordination and gait  Psych: Alert & Oriented x 3, Mood appear stable.    Assessment:    Annual wellness medicare exam   Plan:    During the course of the visit the patient was educated and counseled about appropriate screening and preventive services including:   Annual mammogram  Declines flu and Prevnar     Patient Instructions (the written plan) was given to the patient.  Medicare Attestation  I have personally reviewed:  The patient's medical and social history  Their use of alcohol, tobacco or illicit drugs  Their current medications and supplements  The patient's functional ability including ADLs,fall risks, home safety risks, cognitive, and hearing and visual impairment  Diet and physical activities  Evidence for depression or mood disorders  The patient's weight, height, BMI, and visual acuity have been recorded in the chart. I have made referrals, counseling, and provided education to the patient based on review of the above and I have provided  the patient with a written personalized care plan for preventive services.

## 2015-07-29 ENCOUNTER — Telehealth: Payer: Self-pay

## 2015-07-29 MED ORDER — BLOOD GLUCOSE MONITOR KIT: PACK | Status: AC

## 2015-07-29 NOTE — Telephone Encounter (Signed)
-----   Message from Elby Showers, MD sent at 07/29/2015 10:49 AM EDT ----- Needs home glucose monitor. Needs strict diet and exercise.Please call with [facility] results.

## 2015-07-29 NOTE — Telephone Encounter (Signed)
Patient aware of results and recommendations.  Glucometer order sent to [facility].

## 2015-09-09 ENCOUNTER — Other Ambulatory Visit: Payer: Self-pay | Admitting: Internal Medicine

## 2015-10-26 MED FILL — ZETIA 10 MG TABLET: 10 | 30 days supply | Qty: 30 | Fill #3

## 2015-11-17 ENCOUNTER — Other Ambulatory Visit: Payer: Medicare Other | Admitting: Internal Medicine

## 2015-11-17 DIAGNOSIS — E785 Hyperlipidemia, unspecified: Secondary | ICD-10-CM

## 2015-11-17 DIAGNOSIS — E119 Type 2 diabetes mellitus without complications: Secondary | ICD-10-CM

## 2015-11-17 LAB — LIPID PANEL
CHOL/HDL RATIO: 5 ratio (ref <=5.0)
CHOLESTEROL: 201 mg/dL — AB (ref 125–200)
HDL: 40 mg/dL — AB (ref >=46)
LDL CALC: 125 mg/dL (ref <130)
TRIGLYCERIDES: 180 mg/dL — AB (ref <150)
VLDL: 36 mg/dL — AB (ref <30)

## 2015-11-18 LAB — HEMOGLOBIN A1C
HEMOGLOBIN A1C: 7.4 % — AB (ref <5.7)
Mean Plasma Glucose: 166 mg/dL — ABNORMAL HIGH (ref <117)

## 2015-11-19 ENCOUNTER — Ambulatory Visit (INDEPENDENT_AMBULATORY_CARE_PROVIDER_SITE_OTHER): Payer: Medicare Other | Admitting: Internal Medicine

## 2015-11-19 ENCOUNTER — Encounter: Payer: Self-pay | Admitting: Internal Medicine

## 2015-11-19 VITALS — BP 130/76 | HR 89 | Temp 97.0°F | Resp 20 | Ht 64.0 in | Wt 180.0 lb

## 2015-11-19 DIAGNOSIS — E785 Hyperlipidemia, unspecified: Secondary | ICD-10-CM

## 2015-11-19 DIAGNOSIS — E669 Obesity, unspecified: Secondary | ICD-10-CM

## 2015-11-19 DIAGNOSIS — E8881 Metabolic syndrome: Secondary | ICD-10-CM | POA: Diagnosis not present

## 2015-11-19 DIAGNOSIS — I10 Essential (primary) hypertension: Secondary | ICD-10-CM

## 2015-11-19 DIAGNOSIS — E119 Type 2 diabetes mellitus without complications: Secondary | ICD-10-CM | POA: Diagnosis not present

## 2015-11-19 NOTE — Progress Notes (Signed)
   Subjective:    Patient ID: Courtney Crosby, female    DOB: 12/13/1945, 70 y.o.   MRN: [id number]  HPI Here today to follow-up on essential hypertension, hyperlipidemia, metabolic syndrome, controlled type 2 diabetes mellitus, obesity. At physical exam this past Octobe, she was told to lose 20 pounds. She has lost 5 pounds. Blood pressure is stable at this time. She had recent lab work including hemoglobin A1c and lipid panel. Total cholesterol has improved from 226-201. Triglycerides have improved from 201-180. LDL cholesterol has improved from 148-125. She has a low HDL cholesterol. No issues with Zetia. Hemoglobin A1c has improved from 7.6% to 7.4%. It was better last year.    Review of Systems     Objective:   Physical Exam  Neck: No JVD present. No thyromegaly present.  Cardiovascular: Normal heart sounds.   Pulmonary/Chest: She has no wheezes. She has no rales.  Musculoskeletal: She exhibits no edema.  Lymphadenopathy:    She has no cervical adenopathy.  Vitals reviewed.   Not examined. Spent 25 minutes speaking with her about these issues. Would like to see her lose more weight. Says it was hard over the holidays.      Assessment & Plan:  Essential hypertension-stable on medication  Controlled type 2 diabetes mellitus-improved. Reminded about diabetic eye exam. Urine microalbumin sent.  Obesity  Metabolic syndrome. Hyperlipidemia-improved on Zetia and diet   Plan: Patient prefers not to return in 3 months for six-month recheck but rather prefers to return in October for physical examination. We'll make that appointment and she'll continue to work diligently on diet exercise and weight loss.

## 2015-11-19 NOTE — Addendum Note (Signed)
Addended by: Beryle Quant on: 11/19/2015 02:13 PM   Modules accepted: Orders

## 2015-11-19 NOTE — Patient Instructions (Signed)
Lipid panel has improved with Zetia. Hemoglobin A1c has improved with diet. Keep up the good work and continue weight loss efforts. Return in October for physical examination and fasting lab work.

## 2015-11-20 LAB — MICROALBUMIN, URINE: MICROALB UR: 0.6 mg/dL

## 2015-12-01 ENCOUNTER — Other Ambulatory Visit: Payer: Self-pay

## 2015-12-01 DIAGNOSIS — Z1231 Encounter for screening mammogram for malignant neoplasm of breast: Secondary | ICD-10-CM

## 2015-12-01 MED FILL — ZETIA 10 MG TABLET: 10 | 30 days supply | Qty: 30 | Fill #4

## 2015-12-16 MED FILL — LISINOPRIL 10 MG TABLET: 10 | 90 days supply | Qty: 90 | Fill #1

## 2015-12-18 ENCOUNTER — Ambulatory Visit
Admission: RE | Admit: 2015-12-18 | Discharge: 2015-12-18 | Disposition: A | Discharge status: Home / Self Care | Payer: Medicare Other | Source: Ambulatory Visit

## 2015-12-18 DIAGNOSIS — Z1231 Encounter for screening mammogram for malignant neoplasm of breast: Secondary | ICD-10-CM

## 2016-01-06 MED FILL — ZETIA 10 MG TABLET: 10 | 30 days supply | Qty: 30 | Fill #5

## 2016-02-26 ENCOUNTER — Ambulatory Visit: Payer: Medicare Other | Admitting: Internal Medicine

## 2016-03-08 ENCOUNTER — Other Ambulatory Visit: Payer: Self-pay | Admitting: Internal Medicine

## 2016-03-08 MED FILL — ZETIA 10 MG TABLET: 10 | 90 days supply | Qty: 90 | Fill #0

## 2016-04-11 ENCOUNTER — Other Ambulatory Visit: Payer: Self-pay | Admitting: Internal Medicine

## 2016-04-11 MED FILL — LISINOPRIL 10 MG TABLET: 10 | 90 days supply | Qty: 90 | Fill #0

## 2016-04-11 MED FILL — ONE TOUCH ULTRA TEST STRIPS: 25 days supply | Qty: 100 | Fill #0

## 2016-04-11 NOTE — Telephone Encounter (Signed)
Verbal order by Dr. Renold Genta; ok to refill Linsinopril 10mg  #90, no refill.  Faxed request to [facility].  Patient has appointment for CPE, October.

## 2016-04-15 ENCOUNTER — Other Ambulatory Visit: Payer: Self-pay

## 2016-04-15 MED ORDER — GLUCOSE BLOOD VI STRP: ORAL_STRIP | Status: DC

## 2016-04-15 NOTE — Telephone Encounter (Signed)
-  per Dr. Renold Genta faxed on 04/11/16

## 2016-06-19 ENCOUNTER — Other Ambulatory Visit: Payer: Self-pay | Admitting: Internal Medicine

## 2016-07-01 MED FILL — PREDNISOLONE AC 1% EYE DROP: 1 | 25 days supply | Qty: 5 | Fill #0

## 2016-08-02 ENCOUNTER — Other Ambulatory Visit: Payer: Medicare Other | Admitting: Internal Medicine

## 2016-08-02 ENCOUNTER — Other Ambulatory Visit: Payer: Self-pay | Admitting: Internal Medicine

## 2016-08-02 DIAGNOSIS — E7800 Pure hypercholesterolemia, unspecified: Secondary | ICD-10-CM

## 2016-08-02 DIAGNOSIS — I10 Essential (primary) hypertension: Secondary | ICD-10-CM

## 2016-08-02 DIAGNOSIS — E669 Obesity, unspecified: Secondary | ICD-10-CM

## 2016-08-02 LAB — CBC WITH DIFFERENTIAL/PLATELET
BASOS PCT: 1 %
Basophils Absolute: 61 cells/uL (ref 0–200)
EOS PCT: 2 %
Eosinophils Absolute: 122 cells/uL (ref 15–500)
HCT: 48.4 % — ABNORMAL HIGH (ref 35.0–45.0)
HEMOGLOBIN: 16.5 g/dL — AB (ref 11.7–15.5)
LYMPHS ABS: 2135 {cells}/uL (ref 850–3900)
Lymphocytes Relative: 35 %
MCH: 30.5 pg (ref 27.0–33.0)
MCHC: 34.1 g/dL (ref 32.0–36.0)
MCV: 89.5 fL (ref 80.0–100.0)
MPV: 11.6 fL (ref 7.5–12.5)
Monocytes Absolute: 549 cells/uL (ref 200–950)
Monocytes Relative: 9 %
NEUTROS PCT: 53 %
Neutro Abs: 3233 cells/uL (ref 1500–7800)
Platelets: 227 10*3/uL (ref 140–400)
RBC: 5.41 MIL/uL — AB (ref 3.80–5.10)
RDW: 13 % (ref 11.0–15.0)
WBC: 6.1 10*3/uL (ref 3.8–10.8)

## 2016-08-02 LAB — LIPID PANEL
Cholesterol: 200 mg/dL (ref 125–200)
HDL: 44 mg/dL — AB (ref >=46)
LDL CALC: 122 mg/dL (ref <130)
Total CHOL/HDL Ratio: 4.5 Ratio (ref <=5.0)
Triglycerides: 172 mg/dL — ABNORMAL HIGH (ref <150)
VLDL: 34 mg/dL — ABNORMAL HIGH (ref <30)

## 2016-08-02 LAB — COMPREHENSIVE METABOLIC PANEL
ALBUMIN: 4 g/dL (ref 3.6–5.1)
ALT: 33 U/L — AB (ref 6–29)
AST: 29 U/L (ref 10–35)
Alkaline Phosphatase: 51 U/L (ref 33–130)
BILIRUBIN TOTAL: 0.6 mg/dL (ref 0.2–1.2)
BUN: 11 mg/dL (ref 7–25)
CO2: 25 mmol/L (ref 20–31)
CREATININE: 0.81 mg/dL (ref 0.60–0.93)
Calcium: 9.4 mg/dL (ref 8.6–10.4)
Chloride: 103 mmol/L (ref 98–110)
GLUCOSE: 160 mg/dL — AB (ref 65–99)
Potassium: 4.3 mmol/L (ref 3.5–5.3)
SODIUM: 138 mmol/L (ref 135–146)
Total Protein: 6.6 g/dL (ref 6.1–8.1)

## 2016-08-02 LAB — TSH: TSH: 1.51 m[IU]/L

## 2016-08-03 LAB — VITAMIN D 25 HYDROXY (VIT D DEFICIENCY, FRACTURES): VIT D 25 HYDROXY: 29 ng/mL — AB (ref 30–100)

## 2016-08-04 ENCOUNTER — Encounter: Payer: Self-pay | Admitting: Internal Medicine

## 2016-08-04 ENCOUNTER — Ambulatory Visit (INDEPENDENT_AMBULATORY_CARE_PROVIDER_SITE_OTHER): Payer: Medicare Other | Admitting: Internal Medicine

## 2016-08-04 ENCOUNTER — Telehealth: Payer: Self-pay

## 2016-08-04 VITALS — BP 130/90 | HR 72 | Ht 63.0 in | Wt 178.0 lb

## 2016-08-04 DIAGNOSIS — IMO0001 Reserved for inherently not codable concepts without codable children: Secondary | ICD-10-CM

## 2016-08-04 DIAGNOSIS — E781 Pure hyperglyceridemia: Secondary | ICD-10-CM | POA: Diagnosis not present

## 2016-08-04 DIAGNOSIS — F411 Generalized anxiety disorder: Secondary | ICD-10-CM

## 2016-08-04 DIAGNOSIS — Z0000 Encounter for general adult medical examination without abnormal findings: Secondary | ICD-10-CM

## 2016-08-04 DIAGNOSIS — E6609 Other obesity due to excess calories: Secondary | ICD-10-CM

## 2016-08-04 DIAGNOSIS — E119 Type 2 diabetes mellitus without complications: Secondary | ICD-10-CM

## 2016-08-04 DIAGNOSIS — Z789 Other specified health status: Secondary | ICD-10-CM

## 2016-08-04 DIAGNOSIS — K219 Gastro-esophageal reflux disease without esophagitis: Secondary | ICD-10-CM | POA: Diagnosis not present

## 2016-08-04 DIAGNOSIS — Z6832 Body mass index (BMI) 32.0-32.9, adult: Secondary | ICD-10-CM

## 2016-08-04 LAB — POC URINALSYSI DIPSTICK (AUTOMATED)
Bilirubin, UA: NEGATIVE
Blood, UA: NEGATIVE
Glucose, UA: NEGATIVE
KETONES UA: NEGATIVE
Leukocytes, UA: NEGATIVE
Nitrite, UA: NEGATIVE
Protein, UA: NEGATIVE
SPEC GRAV UA: 1.01
Urobilinogen, UA: NEGATIVE
pH, UA: 6.5

## 2016-08-04 LAB — HEMOGLOBIN A1C
Hgb A1c MFr Bld: 7.4 % — ABNORMAL HIGH (ref <5.7)
Mean Plasma Glucose: 166 mg/dL

## 2016-08-04 NOTE — Telephone Encounter (Signed)
Called [facility] to add an A1C.

## 2016-08-04 NOTE — Progress Notes (Signed)
Subjective:    Patient ID: Courtney Crosby, female    DOB: Nov 04, 1945, 70 y.o.   MRN: [id number]  HPI 70 year old White Female in today for health maintenance exam and evaluation of medical issues. Refuses flu  Vaccine. History of type 2 diabetes mellitus, hyperlipidemia, essential hypertension, obesity, anxiety, metabolic syndrome.  Patient says she does not tolerate Lipitor and is tried other lipid-lowering medications but cannot remember which ones. She does not exercise regularly.  She has a history of dependent edema.  She's been treated for precancerous lesions on her face and arms by dermatologist.  She has a history of GE reflux. Remote history of melanoma 1991. Herpes zoster left trunk 2006. Fractured right humerus 2007. History of appendectomy. History of ingrown toenails surgically removed bilaterally. C- sections in 1976 and 1978.  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 2 adult children, son and daughter. Does not consume alcohol. Nonsmoker.  Family history: Mother with history of diabetes and hypertension    Review of Systems anxiety related to family issues     Objective:   Physical Exam  Constitutional: She is oriented to person, place, and time. She appears well-developed and well-nourished. No distress.  HENT:  Head: Normocephalic and atraumatic.  Right Ear: External ear normal.  Left Ear: External ear normal.  Mouth/Throat: Oropharynx is clear and moist.  Eyes: Conjunctivae and EOM are normal. Pupils are equal, round, and reactive to light. Right eye exhibits no discharge. Left eye exhibits no discharge. No scleral icterus.  Neck: Neck supple. No JVD present. No thyromegaly present.  Cardiovascular: Normal rate, regular rhythm, normal heart sounds and intact distal pulses.   No murmur heard. Pulmonary/Chest: Effort normal. She has no wheezes. She has no rales.  Breasts  normal female without masses  Abdominal: Soft. Bowel sounds are normal. She exhibits no distension. There is no tenderness. There is no rebound and no guarding.  Genitourinary:  Genitourinary Comments: Pap done in 2013 and will not be repeated due to age. Bimanual normal.  Musculoskeletal: She exhibits no edema.  Lymphadenopathy:    She has no cervical adenopathy.  Neurological: She is alert and oriented to person, place, and time. She has normal reflexes. No cranial nerve deficit. Coordination normal.  Skin: No rash noted. She is not diaphoretic.  Psychiatric: She has a normal mood and affect. Her behavior is normal. Judgment and thought content normal.  Vitals reviewed.         Assessment & Plan:   Controlled type 2 diabetes mellitus. Hemoglobin A1c 7.4%. Fasting glucose 160  Vitamin D deficiency-recommend 2000 units vitamin D 3 daily  Hypertriglyceridemia-triglycerides elevated 172  Obesity  Anxiety  GE reflux  Plan: Encouraged diet exercise and weight loss. Continue same medications and follow-up in 6 months. Not really motivated at this point time to diet and exercise. Not sure how to motivate this patient.  Declines flu vaccine  Subjective:   Patient presents for Medicare Annual/Subsequent preventive examination.  Review Past Medical/Family/Social:See above   Risk Factors  Current exercise habits:Not really motivated to exercise very much Dietary issues discussed: Low fat low carbohydrate  Cardiac risk factors:Hyperlipidemia, diabetes mellitus, obesity  Depression Screen  (Note: if answer to either of the following is "Yes", a more complete depression screening is indicated)   Over the past two weeks, have you felt down, depressed or hopeless? No  Over the past two weeks, have you felt little  interest or pleasure in doing things? No Have you lost interest or pleasure in daily life? No Do you often feel hopeless? No Do you cry easily over simple problems? No    Activities of Daily Living  In your present state of health, do you have any difficulty performing the following activities?:   Driving? No  Managing money? No  Feeding yourself? No  Getting from bed to chair? No  Climbing a flight of stairs? No  Preparing food and eating?: No  Bathing or showering? No  Getting dressed: No  Getting to the toilet? No  Using the toilet:No  Moving around from place to place: No  In the past year have you fallen or had a near fall?:No  Are you sexually active? No  Do you have more than one partner? No   Hearing Difficulties: No  Do you often ask people to speak up or repeat themselves? No  Do you experience ringing or noises in your ears? No  Do you have difficulty understanding soft or whispered voices? Some time Do you feel that you have a problem with memory? No Do you often misplace items? No    Home Safety:  Do you have a smoke alarm at your residence? Yes Do you have grab bars in the bathroom?Yes Do you have throw rugs in your house? Yes   Cognitive Testing  Alert? Yes Normal Appearance?Yes  Oriented to person? Yes Place? Yes  Time? Yes  Recall of three objects? Yes  Can perform simple calculations? Yes  Displays appropriate judgment?Yes  Can read the correct time from a watch face?Yes   List the Names of Other Physician/Practitioners you currently use:  See referral list for the physicians patient is currently seeing.     Review of Systems: See above   Objective:     General appearance: Appears stated age and obese  Head: Normocephalic, without obvious abnormality, atraumatic  Eyes: conj clear, EOMi PEERLA  Ears: normal TM's and external ear canals both ears  Nose: Nares normal. Septum midline. Mucosa normal. No drainage or sinus tenderness.  Throat: lips, mucosa, and tongue normal; teeth and gums normal  Neck: no adenopathy, no carotid bruit, no JVD, supple, symmetrical, trachea midline and thyroid not enlarged,  symmetric, no tenderness/mass/nodules  No CVA tenderness.  Lungs: clear to auscultation bilaterally  Breasts: normal appearance, no masses or tenderness Heart: regular rate and rhythm, S1, S2 normal, no murmur, click, rub or gallop  Abdomen: soft, non-tender; bowel sounds normal; no masses, no organomegaly  Musculoskeletal: ROM normal in all joints, no crepitus, no deformity, Normal muscle strengthen. Back  is symmetric, no curvature. Skin: Skin color, texture, turgor normal. No rashes or lesions  Lymph nodes: Cervical, supraclavicular, and axillary nodes normal.  Neurologic: CN 2 -12 Normal, Normal symmetric reflexes. Normal coordination and gait  Psych: Alert & Oriented x 3, Mood appear stable.    Assessment:    Annual wellness medicare exam   Plan:    During the course of the visit the patient was educated and counseled about appropriate screening and preventive services including:   Declines flu vaccine  Recommend annual mammogram     Patient Instructions (the written plan) was given to the patient.  Medicare Attestation  I have personally reviewed:  The patient's medical and social history  Their use of alcohol, tobacco or illicit drugs  Their current medications and supplements  The patient's functional ability including ADLs,fall risks, home safety risks, cognitive, and hearing and visual  impairment  Diet and physical activities  Evidence for depression or mood disorders  The patient's weight, height, BMI, and visual acuity have been recorded in the chart. I have made referrals, counseling, and provided education to the patient based on review of the above and I have provided the patient with a written personalized care plan for preventive services.

## 2016-08-08 ENCOUNTER — Other Ambulatory Visit: Payer: Self-pay | Admitting: Internal Medicine

## 2016-08-08 MED FILL — LISINOPRIL 10 MG TABLET: 10 | 90 days supply | Qty: 90 | Fill #0

## 2016-08-09 NOTE — Patient Instructions (Signed)
Please continue diet and exercise. RTC in one year or prn

## 2016-11-17 MED FILL — LISINOPRIL 10 MG TABLET: 10 | 90 days supply | Qty: 90 | Fill #1

## 2016-12-26 LAB — HM DIABETES EYE EXAM

## 2016-12-29 ENCOUNTER — Encounter: Payer: Self-pay | Admitting: Internal Medicine

## 2017-01-20 ENCOUNTER — Other Ambulatory Visit: Payer: Self-pay | Admitting: Internal Medicine

## 2017-01-20 DIAGNOSIS — Z1231 Encounter for screening mammogram for malignant neoplasm of breast: Secondary | ICD-10-CM

## 2017-01-31 ENCOUNTER — Other Ambulatory Visit: Payer: Medicare Other | Admitting: Internal Medicine

## 2017-01-31 DIAGNOSIS — E785 Hyperlipidemia, unspecified: Secondary | ICD-10-CM

## 2017-01-31 DIAGNOSIS — Z1231 Encounter for screening mammogram for malignant neoplasm of breast: Secondary | ICD-10-CM

## 2017-01-31 DIAGNOSIS — E119 Type 2 diabetes mellitus without complications: Secondary | ICD-10-CM

## 2017-01-31 LAB — LIPID PANEL
Cholesterol: 209 mg/dL — ABNORMAL HIGH (ref <200)
HDL: 43 mg/dL — ABNORMAL LOW (ref >50)
LDL CALC: 128 mg/dL — AB (ref <100)
Total CHOL/HDL Ratio: 4.9 Ratio (ref <5.0)
Triglycerides: 190 mg/dL — ABNORMAL HIGH (ref <150)
VLDL: 38 mg/dL — ABNORMAL HIGH (ref <30)

## 2017-02-01 LAB — HEMOGLOBIN A1C
HEMOGLOBIN A1C: 7.8 % — AB (ref <5.7)
MEAN PLASMA GLUCOSE: 177 mg/dL

## 2017-02-01 LAB — MICROALBUMIN / CREATININE URINE RATIO
CREATININE, URINE: 188 mg/dL (ref 20–320)
MICROALB/CREAT RATIO: 6 ug/mg{creat} (ref <30)
Microalb, Ur: 1.1 mg/dL

## 2017-02-02 ENCOUNTER — Ambulatory Visit (INDEPENDENT_AMBULATORY_CARE_PROVIDER_SITE_OTHER): Payer: Medicare Other | Admitting: Internal Medicine

## 2017-02-02 ENCOUNTER — Encounter: Payer: Self-pay | Admitting: Internal Medicine

## 2017-02-02 VITALS — BP 132/90 | HR 76 | Temp 97.2°F | Ht 64.0 in | Wt 179.0 lb

## 2017-02-02 DIAGNOSIS — Z789 Other specified health status: Secondary | ICD-10-CM

## 2017-02-02 DIAGNOSIS — I10 Essential (primary) hypertension: Secondary | ICD-10-CM

## 2017-02-02 DIAGNOSIS — E782 Mixed hyperlipidemia: Secondary | ICD-10-CM

## 2017-02-02 DIAGNOSIS — E119 Type 2 diabetes mellitus without complications: Secondary | ICD-10-CM | POA: Diagnosis not present

## 2017-02-02 DIAGNOSIS — E8881 Metabolic syndrome: Secondary | ICD-10-CM | POA: Diagnosis not present

## 2017-02-02 NOTE — Patient Instructions (Signed)
Continue same medications. Zetia is being taking on a limited basis due to finances. Encouraged diet exercise and weight loss. Return in 6 months for physical exam. Patient declines Prevnar and tetanus immunizations.

## 2017-02-02 NOTE — Progress Notes (Signed)
   Subjective:    Patient ID: Courtney Crosby, female    DOB: 14-Jan-1946, 71 y.o.   MRN: [id number]  HPI 71 year old Female for  6 month follow up. Has not been able to exercise due to weather.Blood pressure is elevated today but she says it runs fine at home. She is gained 1 pound since October 2017. Says she's not been able to exercise very much due to a bad winter.  She declines tetanus immunization and Prevnar 13.  Zetia has been very expensive for her even though is generic. She's only taken it twice a week. Triglycerides have increased. Zetia doesn't work for triglycerides. Total cholesterol has increased from 200 to 209 and LDL cholesterol has increased from 122 to128.  History of statin intolerance.  Hemoglobin A1c is 7.8% and previously was 7.4% 6 months ago. Total cholesterol has increased from 200-209. Triglycerides have increased from 172-190. LDL cholesterol has increased from 122-128.  Diabetic foot exam is normal.    Review of Systems see above     Objective:   Physical Exam  Skin warm and dry. Nodes none. Neck is supple without JVD thyromegaly or carotid bruits. Chest clear to auscultation. Cardiac exam regular rate and rhythm normal S1 and S2. Extremities without edema.      Assessment & Plan:  Controlled type 2 diabetes mellitus-slight increase in hemoglobin A1c  Essential hypertension-patient says blood pressure is stable at home. She is a Marine scientist and checks it regularly.  Hyperlipidemia-statin intolerant needs to work on diet and exercise  Obesity  Metabolic syndrome  Plan: Continue to encourage her to diet exercise and lose weight. Continue same medications and return in 6 months for physical examination.

## 2017-02-13 ENCOUNTER — Ambulatory Visit
Admission: RE | Admit: 2017-02-13 | Discharge: 2017-02-13 | Disposition: A | Discharge status: Home / Self Care | Payer: Medicare Other | Source: Ambulatory Visit | Attending: Internal Medicine | Admitting: Internal Medicine

## 2017-02-27 MED FILL — LISINOPRIL 10 MG TABLET: 10 | 90 days supply | Qty: 90 | Fill #2

## 2017-06-08 MED FILL — LISINOPRIL 10 MG TABLET: 10 | 90 days supply | Qty: 90 | Fill #3

## 2017-08-07 ENCOUNTER — Other Ambulatory Visit: Payer: Medicare Other | Admitting: Internal Medicine

## 2017-08-07 DIAGNOSIS — Z0000 Encounter for general adult medical examination without abnormal findings: Secondary | ICD-10-CM

## 2017-08-07 DIAGNOSIS — C439 Malignant melanoma of skin, unspecified: Secondary | ICD-10-CM

## 2017-08-07 DIAGNOSIS — F419 Anxiety disorder, unspecified: Secondary | ICD-10-CM

## 2017-08-07 DIAGNOSIS — E7800 Pure hypercholesterolemia, unspecified: Secondary | ICD-10-CM

## 2017-08-07 DIAGNOSIS — E118 Type 2 diabetes mellitus with unspecified complications: Secondary | ICD-10-CM

## 2017-08-07 DIAGNOSIS — E8881 Metabolic syndrome: Secondary | ICD-10-CM

## 2017-08-07 DIAGNOSIS — K219 Gastro-esophageal reflux disease without esophagitis: Secondary | ICD-10-CM

## 2017-08-07 DIAGNOSIS — I10 Essential (primary) hypertension: Secondary | ICD-10-CM

## 2017-08-08 LAB — CBC WITH DIFFERENTIAL/PLATELET
BASOS ABS: 70 {cells}/uL (ref 0–200)
Basophils Relative: 1.2 %
EOS PCT: 0 %
Eosinophils Absolute: 0 cells/uL — ABNORMAL LOW (ref 15–500)
HEMATOCRIT: 46.5 % — AB (ref 35.0–45.0)
Hemoglobin: 16.1 g/dL — ABNORMAL HIGH (ref 11.7–15.5)
LYMPHS ABS: 1775 {cells}/uL (ref 850–3900)
MCH: 30.3 pg (ref 27.0–33.0)
MCHC: 34.6 g/dL (ref 32.0–36.0)
MCV: 87.6 fL (ref 80.0–100.0)
MPV: 12.1 fL (ref 7.5–12.5)
Monocytes Relative: 12.1 %
NEUTROS PCT: 56.1 %
Neutro Abs: 3254 cells/uL (ref 1500–7800)
PLATELETS: 225 10*3/uL (ref 140–400)
RBC: 5.31 10*6/uL — ABNORMAL HIGH (ref 3.80–5.10)
RDW: 12.5 % (ref 11.0–15.0)
Total Lymphocyte: 30.6 %
WBC mixed population: 702 cells/uL (ref 200–950)
WBC: 5.8 10*3/uL (ref 3.8–10.8)

## 2017-08-08 LAB — MICROALBUMIN / CREATININE URINE RATIO
Creatinine, Urine: 69 mg/dL (ref 20–275)
MICROALB UR: 0.2 mg/dL
Microalb Creat Ratio: 3 mcg/mg creat (ref <30)

## 2017-08-08 LAB — LIPID PANEL
CHOLESTEROL: 236 mg/dL — AB (ref <200)
HDL: 45 mg/dL — AB (ref >50)
LDL Cholesterol (Calc): 160 mg/dL (calc) — ABNORMAL HIGH
Non-HDL Cholesterol (Calc): 191 mg/dL (calc) — ABNORMAL HIGH (ref <130)
Total CHOL/HDL Ratio: 5.2 (calc) — ABNORMAL HIGH (ref <5.0)
Triglycerides: 163 mg/dL — ABNORMAL HIGH (ref <150)

## 2017-08-08 LAB — COMPLETE METABOLIC PANEL WITH GFR
AG RATIO: 1.6 (calc) (ref 1.0–2.5)
ALKALINE PHOSPHATASE (APISO): 57 U/L (ref 33–130)
ALT: 30 U/L — AB (ref 6–29)
AST: 25 U/L (ref 10–35)
Albumin: 4.2 g/dL (ref 3.6–5.1)
BUN: 11 mg/dL (ref 7–25)
CO2: 27 mmol/L (ref 20–32)
Calcium: 9.6 mg/dL (ref 8.6–10.4)
Chloride: 104 mmol/L (ref 98–110)
Creat: 0.77 mg/dL (ref 0.60–0.93)
GFR, Est African American: 89 mL/min/{1.73_m2} (ref >=60)
GFR, Est Non African American: 77 mL/min/{1.73_m2} (ref >=60)
GLOBULIN: 2.6 g/dL (ref 1.9–3.7)
Glucose, Bld: 187 mg/dL — ABNORMAL HIGH (ref 65–99)
POTASSIUM: 4.4 mmol/L (ref 3.5–5.3)
SODIUM: 139 mmol/L (ref 135–146)
Total Bilirubin: 0.7 mg/dL (ref 0.2–1.2)
Total Protein: 6.8 g/dL (ref 6.1–8.1)

## 2017-08-08 LAB — HEMOGLOBIN A1C
Hgb A1c MFr Bld: 8.3 % of total Hgb — ABNORMAL HIGH (ref <5.7)
Mean Plasma Glucose: 192 (calc)
eAG (mmol/L): 10.6 (calc)

## 2017-08-08 LAB — TSH: TSH: 1.4 mIU/L (ref 0.40–4.50)

## 2017-08-10 ENCOUNTER — Encounter: Payer: Self-pay | Admitting: Internal Medicine

## 2017-08-10 ENCOUNTER — Ambulatory Visit (INDEPENDENT_AMBULATORY_CARE_PROVIDER_SITE_OTHER): Payer: Medicare Other | Admitting: Internal Medicine

## 2017-08-10 VITALS — BP 120/82 | HR 87 | Temp 97.5°F | Ht 63.25 in | Wt 177.0 lb

## 2017-08-10 DIAGNOSIS — Z789 Other specified health status: Secondary | ICD-10-CM

## 2017-08-10 DIAGNOSIS — E782 Mixed hyperlipidemia: Secondary | ICD-10-CM

## 2017-08-10 DIAGNOSIS — E119 Type 2 diabetes mellitus without complications: Secondary | ICD-10-CM

## 2017-08-10 DIAGNOSIS — I10 Essential (primary) hypertension: Secondary | ICD-10-CM

## 2017-08-10 DIAGNOSIS — K219 Gastro-esophageal reflux disease without esophagitis: Secondary | ICD-10-CM

## 2017-08-10 DIAGNOSIS — E8881 Metabolic syndrome: Secondary | ICD-10-CM

## 2017-08-10 DIAGNOSIS — Z0000 Encounter for general adult medical examination without abnormal findings: Secondary | ICD-10-CM

## 2017-08-10 LAB — POCT URINALYSIS DIPSTICK
Bilirubin, UA: NEGATIVE
Glucose, UA: NEGATIVE
KETONES UA: NEGATIVE
Leukocytes, UA: NEGATIVE
Nitrite, UA: NEGATIVE
PROTEIN UA: NEGATIVE
RBC UA: NEGATIVE
SPEC GRAV UA: 1.01 (ref 1.010–1.025)
UROBILINOGEN UA: 0.2 U/dL
pH, UA: 5 (ref 5.0–8.0)

## 2017-08-10 NOTE — Progress Notes (Signed)
Subjective:    Patient ID: Courtney Crosby, female    DOB: 12/20/1945, 71 y.o.   MRN: [id number]  HPI  70 year old Female for health maintenance exam and evaluation of medical issues.  She has a history of type 2 diabetes mellitus, hyperlipidemia, essential hypertension, obesity, anxiety, metabolic syndrome.  Patient says she does not tolerate Lipitor and has tried other lipid-lowering medications but cannot remember which ones.  She does not exercise regularly.  History of dependent edema.  Has been treated for precancerous lesions on face and arms by dermatologist.  She has a history of GE reflux.  Remote history of melanoma 1991.  Herpes zoster left trunk 2006.  Fractured right humerus 2007.  History of appendectomy.  History of ingrown toenail surgery bilaterally.  C-sections in 1976 in 1978.  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  2 adult children, a son and a daughter.  Does not consume alcohol.  Non-smoker.  Family history: Mother with history of diabetes and hypertension.    Review of Systems frustrated with weight.  Anxiety.     Objective:   Physical Exam  Constitutional: She is oriented to person, place, and time. She appears well-developed and well-nourished. No distress.  HENT:  Head: Normocephalic and atraumatic.  Right Ear: External ear normal.  Left Ear: External ear normal.  Mouth/Throat: Oropharynx is clear and moist.  Eyes: Conjunctivae and EOM are normal. Pupils are equal, round, and reactive to light. Right eye exhibits no discharge. Left eye exhibits no discharge. No scleral icterus.  Neck: Neck supple. No JVD present. No thyromegaly present.  Cardiovascular: Normal rate, regular rhythm, normal heart sounds and intact distal pulses.  No murmur heard. Pulmonary/Chest: Effort normal and breath sounds normal. No respiratory distress. She has no wheezes. She has no  rales. She exhibits no tenderness.  Breasts normal female without masses  Abdominal: Soft. Bowel sounds are normal. She exhibits no distension and no mass. There is no tenderness. There is no rebound and no guarding.  Genitourinary:  Genitourinary Comments: Pap done in 2013 and will not be repeated due to age.  Bimanual normal.  Musculoskeletal: She exhibits no edema.  Lymphadenopathy:    She has no cervical adenopathy.  Neurological: She is alert and oriented to person, place, and time. She has normal reflexes. No cranial nerve deficit. Coordination normal.  Skin: Skin is warm and dry. No rash noted. She is not diaphoretic.  Psychiatric: She has a normal mood and affect. Her behavior is normal. Judgment and thought content normal.  Vitals reviewed.         Assessment & Plan:  Type 2 diabetes mellitus.  Her hemoglobin A1c has increased from 7.8% to  8.3%.  I have convinced her to try Januvia and follow-up in December.  Fasting glucose is 187 and 1 year ago was 160.  Vitamin D deficiency   Hyperlipidemia-says she is intolerant of statin medications.  Total cholesterol is increased from 209 236.  Triglycerides have decreased from 190-163.  LDL cholesterol is 160.  Obesity-needs to be serious about diet and exercise  Anxiety-has been prescribed antianxiety medication  GE reflux-stable on PPI  Declines flu vaccine  Plan: Begin Januvia and follow-up in December.  Needs to see dietitian but is not all that willing.  Subjective:   Patient presents for Medicare Annual/Subsequent preventive examination.  Review Past Medical/Family/Social:see above   Risk Factors  Current  exercise habits: sedentary mostly Dietary issues discussed: low fat low carb  Cardiac risk factors: DM and hyperlipidemia  Depression Screen  (Note: if answer to either of the following is "Yes", a more complete depression screening is indicated)   Over the past two weeks, have you felt down, depressed or  hopeless? No  Over the past two weeks, have you felt little interest or pleasure in doing things? No Have you lost interest or pleasure in daily life? No Do you often feel hopeless? No Do you cry easily over simple problems? No   Activities of Daily Living  In your present state of health, do you have any difficulty performing the following activities?:   Driving? No  Managing money? No  Feeding yourself? No  Getting from bed to chair? No  Climbing a flight of stairs? No  Preparing food and eating?: No  Bathing or showering? No  Getting dressed: No  Getting to the toilet? No  Using the toilet:No  Moving around from place to place: No  In the past year have you fallen or had a near fall?:No  Are you sexually active? No  Do you have more than one partner? No   Hearing Difficulties: No  Do you often ask people to speak up or repeat themselves? No  Do you experience ringing or noises in your ears? No  Do you have difficulty understanding soft or whispered voices? No  Do you feel that you have a problem with memory? No Do you often misplace items? No    Home Safety:  Do you have a smoke alarm at your residence? Yes Do you have grab bars in the bathroom? yes Do you have throw rugs in your house?no   Cognitive Testing  Alert? Yes Normal Appearance?Yes  Oriented to person? Yes Place? Yes  Time? Yes  Recall of three objects? Yes  Can perform simple calculations? Yes  Displays appropriate judgment?Yes  Can read the correct time from a watch face?Yes   List the Names of Other Physician/Practitioners you currently use:  See referral list for the physicians patient is  seeing   Objective:     General appearance: Appears stated age and  obese  Head: Normocephalic, without obvious abnormality, atraumatic  Eyes: conj clear, EOMi PEERLA  Ears: normal TM's and external ear canals both ears  Nose: Nares normal. Septum midline. Mucosa normal. No drainage or sinus tenderness.    Throat: lips, mucosa, and tongue normal; teeth and gums normal  Neck: no adenopathy, no carotid bruit, no JVD, supple, symmetrical, trachea midline and thyroid not enlarged, symmetric, no tenderness/mass/nodules  No CVA tenderness.  Lungs: clear to auscultation bilaterally  Breasts: normal appearance, no masses or tenderness Heart: regular rate and rhythm, S1, S2 normal, no murmur, click, rub or gallop  Abdomen: soft, non-tender; bowel sounds normal; no masses, no organomegaly  Musculoskeletal: ROM normal in all joints, no crepitus, no deformity, Normal muscle strengthen. Back  is symmetric, no curvature. Skin: Skin color, texture, turgor normal. No rashes or lesions  Lymph nodes: Cervical, supraclavicular, and axillary nodes normal.  Neurologic: CN 2 -12 Normal, Normal symmetric reflexes. Normal coordination and gait  Psych: Alert & Oriented x 3, Mood appear stable.    Assessment:    Annual wellness medicare exam   Plan:    During the course of the visit the patient was educated and counseled about appropriate screening and preventive services including:   Declines flu vaccine     Patient Instructions (the  written plan) was given to the patient.  Medicare Attestation  I have personally reviewed:  The patient's medical and social history  Their use of alcohol, tobacco or illicit drugs  Their current medications and supplements  The patient's functional ability including ADLs,fall risks, home safety risks, cognitive, and hearing and visual impairment  Diet and physical activities  Evidence for depression or mood disorders  The patient's weight, height, BMI, and visual acuity have been recorded in the chart. I have made referrals, counseling, and provided education to the patient based on review of the above and I have provided the patient with a written personalized care plan for preventive services.

## 2017-08-10 NOTE — Patient Instructions (Signed)
Start Januvia 50 mg daily and RTC in 6 weeks.

## 2017-08-11 MED ORDER — SITAGLIPTIN PHOSPHATE 50 MG PO TABS
50.0000 mg | ORAL_TABLET | Freq: Every day | ORAL | 0 refills | Status: DC
Start: 2017-08-11 — End: 2017-09-07

## 2017-08-11 MED FILL — JANUVIA 50 MG TABLET: 50 | 30 days supply | Qty: 30 | Fill #0

## 2017-08-23 ENCOUNTER — Encounter: Payer: Self-pay | Admitting: Internal Medicine

## 2017-08-30 ENCOUNTER — Other Ambulatory Visit: Payer: Self-pay | Admitting: Internal Medicine

## 2017-08-30 MED FILL — ONE TOUCH ULTRA TEST STRIPS: 25 days supply | Qty: 100 | Fill #0

## 2017-09-07 ENCOUNTER — Other Ambulatory Visit: Payer: Self-pay | Admitting: Internal Medicine

## 2017-09-07 MED FILL — LISINOPRIL 10 MG TABS: 10 | 90 days supply | Qty: 90 | Fill #0

## 2017-09-07 MED FILL — JANUVIA 50 MG TABLET: 50 | 30 days supply | Qty: 30 | Fill #0

## 2017-09-13 ENCOUNTER — Other Ambulatory Visit: Payer: Self-pay | Admitting: Internal Medicine

## 2017-09-13 DIAGNOSIS — E138 Other specified diabetes mellitus with unspecified complications: Secondary | ICD-10-CM

## 2017-09-13 DIAGNOSIS — E7800 Pure hypercholesterolemia, unspecified: Secondary | ICD-10-CM

## 2017-09-15 ENCOUNTER — Other Ambulatory Visit: Payer: Medicare Other | Admitting: Internal Medicine

## 2017-09-18 ENCOUNTER — Other Ambulatory Visit: Payer: Medicare Other | Admitting: Internal Medicine

## 2017-09-19 ENCOUNTER — Ambulatory Visit: Payer: Medicare Other | Admitting: Internal Medicine

## 2017-09-26 ENCOUNTER — Other Ambulatory Visit (INDEPENDENT_AMBULATORY_CARE_PROVIDER_SITE_OTHER): Payer: Medicare Other | Admitting: Internal Medicine

## 2017-09-26 DIAGNOSIS — E138 Other specified diabetes mellitus with unspecified complications: Secondary | ICD-10-CM | POA: Diagnosis not present

## 2017-09-26 DIAGNOSIS — E7800 Pure hypercholesterolemia, unspecified: Secondary | ICD-10-CM

## 2017-09-26 NOTE — Addendum Note (Signed)
Addended by: Mady Haagensen on: 09/26/2017 09:22 AM   Modules accepted: Orders

## 2017-09-27 LAB — LIPID PANEL
CHOL/HDL RATIO: 5.4 (calc) — AB (ref <5.0)
Cholesterol: 223 mg/dL — ABNORMAL HIGH (ref <200)
HDL: 41 mg/dL — AB (ref >50)
LDL Cholesterol (Calc): 152 mg/dL (calc) — ABNORMAL HIGH
NON-HDL CHOLESTEROL (CALC): 182 mg/dL — AB (ref <130)
TRIGLYCERIDES: 166 mg/dL — AB (ref <150)

## 2017-09-27 LAB — HEMOGLOBIN A1C
HEMOGLOBIN A1C: 7.7 %{Hb} — AB (ref <5.7)
MEAN PLASMA GLUCOSE: 174 (calc)
eAG (mmol/L): 9.7 (calc)

## 2017-09-29 ENCOUNTER — Ambulatory Visit (INDEPENDENT_AMBULATORY_CARE_PROVIDER_SITE_OTHER): Payer: Medicare Other | Admitting: Internal Medicine

## 2017-09-29 ENCOUNTER — Encounter: Payer: Self-pay | Admitting: Internal Medicine

## 2017-09-29 VITALS — BP 130/90 | HR 82 | Ht 63.25 in | Wt 175.0 lb

## 2017-09-29 DIAGNOSIS — E119 Type 2 diabetes mellitus without complications: Secondary | ICD-10-CM | POA: Diagnosis not present

## 2017-09-29 DIAGNOSIS — I10 Essential (primary) hypertension: Secondary | ICD-10-CM

## 2017-09-29 DIAGNOSIS — E782 Mixed hyperlipidemia: Secondary | ICD-10-CM

## 2017-09-29 MED ORDER — CLOBETASOL PROP EMOLLIENT BASE 0.05 % EX CREA: TOPICAL_CREAM | CUTANEOUS | 1 refills | Status: DC

## 2017-09-29 MED ORDER — ROSUVASTATIN CALCIUM 5 MG PO TABS
ORAL_TABLET | ORAL | 3 refills | Status: DC
Start: 2017-09-29 — End: 2018-08-21

## 2017-09-29 NOTE — Patient Instructions (Signed)
Continue Januvia 50 mg daily.  Start Crestor 5 mg 3 times a week.  Follow-up in 6 weeks.

## 2017-09-29 NOTE — Progress Notes (Signed)
   Subjective:    Patient ID: Courtney Crosby, female    DOB: [birth year], 71 y.o.   MRN: [id number]  HPI She is here for follow-up on new onset diabetes mellitus.  Last visit was placed on Januvia 50 mg daily.  She says that she feels it caused what sounds like to me obstipation with mushy stool but not frank diarrhea.  She thinks that is getting a bit better.  Her hemoglobin A1c has improved from 8.3% to 7.7%.  At last visit she did not want to be on statin medication.  There is been no significant change in her cholesterol or triglyceride levels.  Total cholesterol is 223, triglycerides 166 and LDL cholesterol 152.  I have convinced her to try Crestor 5 mg taking it only 3 times a week.  She will return in 6 weeks for follow-up.  I offered to switch her from Januvia to [place] or [place] but she does not want to do that.  She definitely needs to be on glucose reducing medication.    Review of Systems     Objective:   Physical Exam She was not examined today.  Spent 15 minutes speaking with her about these issues.       Assessment & Plan:  New onset diabetes mellitus-hemoglobin graph hyperlipidemia  Plan: Continue Januvia 50 mg daily, take Crestor 5 mg 3 times a week.  Follow-up in 6 weeks.  Continue diet exercise and weight loss efforts,

## 2017-10-05 ENCOUNTER — Other Ambulatory Visit: Payer: Self-pay | Admitting: Internal Medicine

## 2017-10-05 MED FILL — JANUVIA 50 MG TABLET: 50 | 30 days supply | Qty: 30 | Fill #0

## 2017-10-20 ENCOUNTER — Other Ambulatory Visit: Payer: Self-pay | Admitting: Internal Medicine

## 2017-10-20 DIAGNOSIS — E119 Type 2 diabetes mellitus without complications: Secondary | ICD-10-CM

## 2017-10-20 DIAGNOSIS — R252 Cramp and spasm: Secondary | ICD-10-CM

## 2017-10-20 DIAGNOSIS — E785 Hyperlipidemia, unspecified: Secondary | ICD-10-CM

## 2017-10-20 DIAGNOSIS — Z79899 Other long term (current) drug therapy: Secondary | ICD-10-CM

## 2017-11-10 ENCOUNTER — Other Ambulatory Visit: Payer: Medicare Other | Admitting: Internal Medicine

## 2017-11-10 DIAGNOSIS — E785 Hyperlipidemia, unspecified: Secondary | ICD-10-CM

## 2017-11-10 DIAGNOSIS — E119 Type 2 diabetes mellitus without complications: Secondary | ICD-10-CM

## 2017-11-10 DIAGNOSIS — R252 Cramp and spasm: Secondary | ICD-10-CM

## 2017-11-10 DIAGNOSIS — Z79899 Other long term (current) drug therapy: Secondary | ICD-10-CM

## 2017-11-11 LAB — HEPATIC FUNCTION PANEL
AG Ratio: 1.6 (calc) (ref 1.0–2.5)
ALKALINE PHOSPHATASE (APISO): 53 U/L (ref 33–130)
ALT: 24 U/L (ref 6–29)
AST: 19 U/L (ref 10–35)
Albumin: 4.1 g/dL (ref 3.6–5.1)
BILIRUBIN DIRECT: 0.1 mg/dL (ref 0.0–0.2)
BILIRUBIN TOTAL: 0.6 mg/dL (ref 0.2–1.2)
Globulin: 2.5 g/dL (calc) (ref 1.9–3.7)
Indirect Bilirubin: 0.5 mg/dL (calc) (ref 0.2–1.2)
Total Protein: 6.6 g/dL (ref 6.1–8.1)

## 2017-11-11 LAB — HEMOGLOBIN A1C
EAG (MMOL/L): 10 (calc)
HEMOGLOBIN A1C: 7.9 %{Hb} — AB (ref <5.7)
MEAN PLASMA GLUCOSE: 180 (calc)

## 2017-11-11 LAB — LIPID PANEL
CHOLESTEROL: 173 mg/dL (ref <200)
HDL: 47 mg/dL — AB (ref >50)
LDL Cholesterol (Calc): 101 mg/dL (calc) — ABNORMAL HIGH
NON-HDL CHOLESTEROL (CALC): 126 mg/dL (ref <130)
Total CHOL/HDL Ratio: 3.7 (calc) (ref <5.0)
Triglycerides: 150 mg/dL — ABNORMAL HIGH (ref <150)

## 2017-11-11 LAB — MAGNESIUM: Magnesium: 2.2 mg/dL (ref 1.5–2.5)

## 2017-11-13 ENCOUNTER — Encounter: Payer: Self-pay | Admitting: Internal Medicine

## 2017-11-13 ENCOUNTER — Ambulatory Visit: Payer: Medicare Other | Admitting: Internal Medicine

## 2017-11-13 VITALS — BP 110/80 | HR 90 | Ht 63.25 in | Wt 177.0 lb

## 2017-11-13 DIAGNOSIS — E782 Mixed hyperlipidemia: Secondary | ICD-10-CM | POA: Diagnosis not present

## 2017-11-13 DIAGNOSIS — E119 Type 2 diabetes mellitus without complications: Secondary | ICD-10-CM

## 2017-11-13 MED ORDER — SITAGLIPTIN PHOSPHATE 100 MG PO TABS: 100.0000 mg | ORAL_TABLET | Freq: Every day | ORAL | 1 refills | Status: DC

## 2017-11-13 MED FILL — JANUVIA 100 MG TABLET: 100 | 90 days supply | Qty: 90 | Fill #0

## 2017-11-13 NOTE — Progress Notes (Signed)
   Subjective:    Patient ID: Courtney Crosby, female    DOB: 01-31-1946, 72 y.o.   MRN: [id number]  HPI 72 year old Female who recently started on glucose lowering medication.  Has been on Januvia 50 mg daily since November.  Hemoglobin A1c improved from 8.3% to 7.7%.  Also persuaded her to take Crestor 5 mg 3 times a week.  Hemoglobin A1c drawn on February 1 is 7.9% and was 7.7% when checked in December.  She is compliant with her medications but may be has not been watching her diet.    Review of Systems     Objective:   Physical Exam  Not examined but spent 15 minutes speaking with her about diabetic management.      Assessment & Plan:  Type 2 diabetes-currently on Januvia 50 mg daily.  Would like to see control improved.  Increase to 100 mg daily and follow-up in March.  Would like to check fasting lipid panel and liver functions at that time if compliant with Crestor.

## 2017-12-03 NOTE — Patient Instructions (Signed)
Increase Januvia from 50-100 mg daily and follow-up in March.  Continue Crestor 5 mg 3 times weekly

## 2017-12-29 LAB — HM DIABETES EYE EXAM

## 2018-01-02 ENCOUNTER — Other Ambulatory Visit: Payer: Medicare Other | Admitting: Internal Medicine

## 2018-01-02 DIAGNOSIS — E119 Type 2 diabetes mellitus without complications: Secondary | ICD-10-CM

## 2018-01-03 LAB — HEMOGLOBIN A1C
Hgb A1c MFr Bld: 8 % of total Hgb — ABNORMAL HIGH (ref <5.7)
Mean Plasma Glucose: 183 (calc)
eAG (mmol/L): 10.1 (calc)

## 2018-01-04 ENCOUNTER — Encounter: Payer: Self-pay | Admitting: Internal Medicine

## 2018-01-08 ENCOUNTER — Encounter: Payer: Self-pay | Admitting: Internal Medicine

## 2018-01-08 ENCOUNTER — Ambulatory Visit: Payer: Medicare Other | Admitting: Internal Medicine

## 2018-01-08 VITALS — BP 160/90 | HR 77 | Ht 63.0 in | Wt 177.0 lb

## 2018-01-08 DIAGNOSIS — E119 Type 2 diabetes mellitus without complications: Secondary | ICD-10-CM

## 2018-01-08 DIAGNOSIS — E782 Mixed hyperlipidemia: Secondary | ICD-10-CM

## 2018-01-08 NOTE — Progress Notes (Addendum)
   Subjective:    Patient ID: Courtney Crosby, female    DOB: Feb 04, 1946, 72 y.o.   MRN: [id number]  HPI She is here today to follow-up on elevated hemoglobin A1c.  She currently is on Januvia and denies noncompliance with it.  In February her A1c was elevated at 7.9%.  Januvia was increased to 100 mg daily.  Now it is 8%.  She admits she is not watching her diet.  She does not want to go see a dietitian.  She does not want to take metformin.  She has not been exercising.  She does not want to see an endocrinologist.  She says she thinks  Crestor was causing some muscle aches in her thighs.  She has cut it down to twice a week.  She does not really want to take that either.  At this point I am frustrated with her because she is not doing her part to improve her glucose control.  She cannot tell me if she is taking 50 or 100 mg of Januvia at the present time.  I have asked her to check her bottle and give me a call back and we can try to figure something out.  For now she will just stay off of Crestor.    Review of Systems     Objective:   Physical Exam  Not examined.  Her hemoglobin A1c is 8% in 1 month ago was 7.9%.  In February, it was 7.7%.      Assessment & Plan:  Type 2 diabetes mellitus-she called back and said she is on Januvia 100 mg daily  Hyperlipidemia-complaining of myalgias on daily Crestor.  Stop Crestor for now.  Plan: Continue Crestor twice a week and continue Januvia 100 mg daily.  She should take diet exercise and weight loss seriously and follow-up in 8 weeks.  We will give her one more chance to improve.

## 2018-01-08 NOTE — Patient Instructions (Addendum)
Patient to call me with dose of Januvia that she is taking and we will advise further.  Stop Crestor for now.  Explained that insurance companies will expect her to keep A1c less than 8% and will expect her to be on statin medication for diabetes.  Addendum: Continue Januvia 100 mg daily.  Take diet exercise and weight loss seriously and follow-up in 8 weeks.

## 2018-01-10 MED FILL — LISINOPRIL 10 MG TABS: 10 | 90 days supply | Qty: 90 | Fill #1

## 2018-01-10 MED FILL — ONE TOUCH ULTRA TEST STRIPS: 25 days supply | Qty: 100 | Fill #1

## 2018-02-19 ENCOUNTER — Other Ambulatory Visit: Payer: Self-pay

## 2018-02-19 DIAGNOSIS — E119 Type 2 diabetes mellitus without complications: Secondary | ICD-10-CM

## 2018-02-19 MED FILL — JANUVIA 100 MG TABLET: 100 | 90 days supply | Qty: 90 | Fill #1

## 2018-03-06 ENCOUNTER — Other Ambulatory Visit: Payer: Medicare Other | Admitting: Internal Medicine

## 2018-03-06 DIAGNOSIS — E119 Type 2 diabetes mellitus without complications: Secondary | ICD-10-CM

## 2018-03-07 LAB — HEMOGLOBIN A1C
HEMOGLOBIN A1C: 7.5 %{Hb} — AB (ref <5.7)
MEAN PLASMA GLUCOSE: 169 (calc)
eAG (mmol/L): 9.3 (calc)

## 2018-03-28 ENCOUNTER — Other Ambulatory Visit: Payer: Self-pay | Admitting: Internal Medicine

## 2018-03-28 DIAGNOSIS — Z1231 Encounter for screening mammogram for malignant neoplasm of breast: Secondary | ICD-10-CM

## 2018-04-18 ENCOUNTER — Ambulatory Visit
Admission: RE | Admit: 2018-04-18 | Discharge: 2018-04-18 | Disposition: A | Discharge status: Home / Self Care | Payer: Medicare Other | Source: Ambulatory Visit | Attending: Internal Medicine | Admitting: Internal Medicine

## 2018-04-18 DIAGNOSIS — Z1231 Encounter for screening mammogram for malignant neoplasm of breast: Secondary | ICD-10-CM

## 2018-05-10 MED FILL — LISINOPRIL 10 MG TABLET: 10 | 90 days supply | Qty: 90 | Fill #2

## 2018-05-21 ENCOUNTER — Other Ambulatory Visit: Payer: Self-pay | Admitting: Internal Medicine

## 2018-05-21 MED FILL — JANUVIA 100 MG TABLET: 100 | 90 days supply | Qty: 90 | Fill #0

## 2018-08-16 ENCOUNTER — Other Ambulatory Visit: Payer: Self-pay | Admitting: Internal Medicine

## 2018-08-16 DIAGNOSIS — E782 Mixed hyperlipidemia: Secondary | ICD-10-CM

## 2018-08-16 DIAGNOSIS — E8881 Metabolic syndrome: Secondary | ICD-10-CM

## 2018-08-16 DIAGNOSIS — K219 Gastro-esophageal reflux disease without esophagitis: Secondary | ICD-10-CM

## 2018-08-16 DIAGNOSIS — E119 Type 2 diabetes mellitus without complications: Secondary | ICD-10-CM

## 2018-08-16 DIAGNOSIS — I10 Essential (primary) hypertension: Secondary | ICD-10-CM

## 2018-08-20 ENCOUNTER — Ambulatory Visit (INDEPENDENT_AMBULATORY_CARE_PROVIDER_SITE_OTHER): Payer: Medicare Other | Admitting: Internal Medicine

## 2018-08-20 ENCOUNTER — Other Ambulatory Visit (INDEPENDENT_AMBULATORY_CARE_PROVIDER_SITE_OTHER): Payer: Medicare Other | Admitting: Internal Medicine

## 2018-08-20 DIAGNOSIS — E8881 Metabolic syndrome: Secondary | ICD-10-CM

## 2018-08-20 DIAGNOSIS — E119 Type 2 diabetes mellitus without complications: Secondary | ICD-10-CM

## 2018-08-20 DIAGNOSIS — Z0000 Encounter for general adult medical examination without abnormal findings: Secondary | ICD-10-CM

## 2018-08-20 DIAGNOSIS — I10 Essential (primary) hypertension: Secondary | ICD-10-CM

## 2018-08-20 DIAGNOSIS — E782 Mixed hyperlipidemia: Secondary | ICD-10-CM

## 2018-08-20 DIAGNOSIS — K219 Gastro-esophageal reflux disease without esophagitis: Secondary | ICD-10-CM

## 2018-08-20 LAB — POCT URINALYSIS DIPSTICK
Appearance: NEGATIVE
BILIRUBIN UA: NEGATIVE
Blood, UA: NEGATIVE
GLUCOSE UA: NEGATIVE
Ketones, UA: NEGATIVE
Leukocytes, UA: NEGATIVE
Nitrite, UA: NEGATIVE
ODOR: NEGATIVE
PROTEIN UA: NEGATIVE
Spec Grav, UA: 1.01 (ref 1.010–1.025)
Urobilinogen, UA: 0.2 E.U./dL
pH, UA: 7.5 (ref 5.0–8.0)

## 2018-08-20 NOTE — Addendum Note (Signed)
Addended by: Mady Haagensen on: 08/20/2018 10:03 AM   Modules accepted: Orders

## 2018-08-21 ENCOUNTER — Other Ambulatory Visit: Payer: Self-pay | Admitting: Internal Medicine

## 2018-08-21 ENCOUNTER — Encounter: Payer: Self-pay | Admitting: Internal Medicine

## 2018-08-21 ENCOUNTER — Ambulatory Visit (INDEPENDENT_AMBULATORY_CARE_PROVIDER_SITE_OTHER): Payer: Medicare Other | Admitting: Internal Medicine

## 2018-08-21 VITALS — BP 120/90 | HR 86 | Ht 63.0 in | Wt 173.0 lb

## 2018-08-21 DIAGNOSIS — E119 Type 2 diabetes mellitus without complications: Secondary | ICD-10-CM

## 2018-08-21 DIAGNOSIS — E2839 Other primary ovarian failure: Secondary | ICD-10-CM

## 2018-08-21 DIAGNOSIS — G4762 Sleep related leg cramps: Secondary | ICD-10-CM

## 2018-08-21 DIAGNOSIS — E8881 Metabolic syndrome: Secondary | ICD-10-CM | POA: Diagnosis not present

## 2018-08-21 DIAGNOSIS — I10 Essential (primary) hypertension: Secondary | ICD-10-CM

## 2018-08-21 DIAGNOSIS — Z6830 Body mass index (BMI) 30.0-30.9, adult: Secondary | ICD-10-CM

## 2018-08-21 DIAGNOSIS — K219 Gastro-esophageal reflux disease without esophagitis: Secondary | ICD-10-CM

## 2018-08-21 DIAGNOSIS — Z0000 Encounter for general adult medical examination without abnormal findings: Secondary | ICD-10-CM

## 2018-08-21 DIAGNOSIS — E782 Mixed hyperlipidemia: Secondary | ICD-10-CM

## 2018-08-21 DIAGNOSIS — Z789 Other specified health status: Secondary | ICD-10-CM

## 2018-08-21 DIAGNOSIS — M8580 Other specified disorders of bone density and structure, unspecified site: Secondary | ICD-10-CM

## 2018-08-21 LAB — LIPID PANEL
Cholesterol: 228 mg/dL — ABNORMAL HIGH (ref <200)
HDL: 42 mg/dL — AB (ref >50)
LDL Cholesterol (Calc): 158 mg/dL (calc) — ABNORMAL HIGH
NON-HDL CHOLESTEROL (CALC): 186 mg/dL — AB (ref <130)
TRIGLYCERIDES: 150 mg/dL — AB (ref <150)
Total CHOL/HDL Ratio: 5.4 (calc) — ABNORMAL HIGH (ref <5.0)

## 2018-08-21 LAB — COMPLETE METABOLIC PANEL WITH GFR
AG RATIO: 1.7 (calc) (ref 1.0–2.5)
ALBUMIN MSPROF: 4.3 g/dL (ref 3.6–5.1)
ALT: 25 U/L (ref 6–29)
AST: 21 U/L (ref 10–35)
Alkaline phosphatase (APISO): 53 U/L (ref 33–130)
BILIRUBIN TOTAL: 0.5 mg/dL (ref 0.2–1.2)
BUN: 14 mg/dL (ref 7–25)
CALCIUM: 9.7 mg/dL (ref 8.6–10.4)
CHLORIDE: 104 mmol/L (ref 98–110)
CO2: 26 mmol/L (ref 20–32)
Creat: 0.73 mg/dL (ref 0.60–0.93)
GFR, EST AFRICAN AMERICAN: 94 mL/min/{1.73_m2} (ref >=60)
GFR, Est Non African American: 81 mL/min/{1.73_m2} (ref >=60)
GLOBULIN: 2.6 g/dL (ref 1.9–3.7)
Glucose, Bld: 162 mg/dL — ABNORMAL HIGH (ref 65–99)
POTASSIUM: 5.2 mmol/L (ref 3.5–5.3)
Sodium: 139 mmol/L (ref 135–146)
TOTAL PROTEIN: 6.9 g/dL (ref 6.1–8.1)

## 2018-08-21 LAB — MICROALBUMIN / CREATININE URINE RATIO
CREATININE, URINE: 36 mg/dL (ref 20–275)
MICROALB UR: 0.2 mg/dL
Microalb Creat Ratio: 6 mcg/mg creat (ref <30)

## 2018-08-21 LAB — CBC WITH DIFFERENTIAL/PLATELET
BASOS ABS: 58 {cells}/uL (ref 0–200)
Basophils Relative: 0.8 %
Eosinophils Absolute: 180 cells/uL (ref 15–500)
Eosinophils Relative: 2.5 %
HCT: 46.6 % — ABNORMAL HIGH (ref 35.0–45.0)
Hemoglobin: 16 g/dL — ABNORMAL HIGH (ref 11.7–15.5)
Lymphs Abs: 2297 cells/uL (ref 850–3900)
MCH: 30 pg (ref 27.0–33.0)
MCHC: 34.3 g/dL (ref 32.0–36.0)
MCV: 87.4 fL (ref 80.0–100.0)
MPV: 12.5 fL (ref 7.5–12.5)
Monocytes Relative: 10.9 %
NEUTROS PCT: 53.9 %
Neutro Abs: 3881 cells/uL (ref 1500–7800)
PLATELETS: 230 10*3/uL (ref 140–400)
RBC: 5.33 10*6/uL — AB (ref 3.80–5.10)
RDW: 12.6 % (ref 11.0–15.0)
TOTAL LYMPHOCYTE: 31.9 %
WBC mixed population: 785 cells/uL (ref 200–950)
WBC: 7.2 10*3/uL (ref 3.8–10.8)

## 2018-08-21 LAB — HEMOGLOBIN A1C
EAG (MMOL/L): 9.8 (calc)
HEMOGLOBIN A1C: 7.8 %{Hb} — AB (ref <5.7)
Mean Plasma Glucose: 177 (calc)

## 2018-08-21 LAB — TSH: TSH: 1.52 mIU/L (ref 0.40–4.50)

## 2018-08-21 MED FILL — JANUVIA 100 MG TABLET: 100 | 30 days supply | Qty: 30 | Fill #0

## 2018-08-21 MED FILL — LISINOPRIL 10 MG TABS: 10 | 90 days supply | Qty: 90 | Fill #3

## 2018-08-21 NOTE — Progress Notes (Signed)
Subjective:    Patient ID: Courtney Crosby, female    DOB: 02-14-1946, 72 y.o.   MRN: [id number]  HPI 72 year old Female for [employer]. Complaint that statins cause myalgias. Not taking Crestor 3 times weekly.  Cholesterol has increased. Says Januvia causes IBS symptoms with both constipation or diarrhea.  Hx essential HTN, obesity and metabolic syndrome.  Not exercising as much as she should. Has been doing yard work. Eats a Mr. Donell Sievert chocolate bar once a week.  Had diabetic eye exam in March with Dr. Delman Cheadle.  Asked pt to consider Cologuard. Has never had colonoscopy.  Discussion about medical issues, compliance and Medicare requirements today.  Pt elects to defer tetanus immunization until injured.  Has been treated for precancerous lesions on face and arms by dermatologist.  Had [place].  Herpes zoster left trunk 2006.  Fractured right humerus 2007.  History of appendectomy.  History of ingrown toenail surgery bilaterally.  [place].  History of GE reflux.  Discussion about compliance with medical regimen. Is defiant that it is her right to choose. Explained that medicare and insurance companies are monitoring performance metrics for both myself and her as well.  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  2 adult children, son and a daughter.  Does not consume alcohol.  Non-smoker.  Family history: Mother with history of diabetes and hypertension.     Review of Systems complain of alternating constipation and diarrhea that she attributes to Januvia.  Nocturnal leg cramps have improved recently.  Takes mustard for that.  History of osteopenia: Bone density study 2014 and has not had one since.     Objective:   Physical Exam  Constitutional: She is oriented to person, place, and time. She appears well-developed and well-nourished. No distress.  HENT:    Head: Normocephalic.  Right Ear: External ear normal.  Left Ear: External ear normal.  Nose: Nose normal.  Mouth/Throat: Oropharynx is clear and moist.  Eyes: Pupils are equal, round, and reactive to light. Conjunctivae and EOM are normal. Right eye exhibits no discharge. Left eye exhibits no discharge. No scleral icterus.  Neck: Neck supple. No JVD present. No thyromegaly present.  Cardiovascular: Normal rate, regular rhythm, normal heart sounds and intact distal pulses.  No murmur heard. Pulmonary/Chest: Effort normal and breath sounds normal. No stridor. No respiratory distress. She has no wheezes. She has no rales.  Abdominal: Soft. Bowel sounds are normal. She exhibits no distension and no mass. There is no tenderness. There is no rebound and no guarding.  Genitourinary:  Genitourinary Comments: Bimanual normal.  Pap deferred due to age  Musculoskeletal: She exhibits no edema.  Lymphadenopathy:    She has no cervical adenopathy.  Neurological: She is alert and oriented to person, place, and time. She displays normal reflexes. No cranial nerve deficit or sensory deficit. She exhibits normal muscle tone. Coordination normal.  Skin: Skin is warm and dry. No rash noted. She is not diaphoretic. No pallor.  Psychiatric: She has a normal mood and affect. Her behavior is normal. Judgment and thought content normal.  Vitals reviewed.  Blood pressure 120/90, pulse 86, weight 173 pounds.  BMI 30.65.       Assessment & Plan:  Normal health maintenance exam  Hemoglobin A1c has increased from 7.5% to 7.8%.  She is compliant with Januvia but complains it is causing diarrhea and alternating  constipation.  Statin intolerance.  She has stopped taking Crestor 3 times a week and total cholesterol has increased from  173 to 228.  She has a low HDL of 42.  Triglycerides remain at 150.  LDL cholesterol has increased from 101 to 158.  BMI 30.65- we  talked with her about diet and exercise.  Plan: I  think I have convinced her to see an endocrinologist regarding her medication issues and to see if we can get her glucose management under better control as well as her cholesterol management.  I thought about placing her on Zetia but she says no to this.  She has a history of vitamin D deficiency but we did not check this today because Medicare is charging $100 for this test and patients are complaining about it.  She had bone density study 2014.  T score was -1.5 in the femur and -1.3 in the spine.  Plan: Endocrine evaluation.  Follow-up in 6 months.  Subjective:   Patient presents for Medicare Annual/Subsequent preventive examination.  Review Past Medical/Family/Social: See above   Risk Factors  Current exercise habits: Yard work Dietary issues discussed: Low-fat low carbohydrate  Cardiac risk factors: Hyperlipidemia, diabetes mellitus  Depression Screen  (Note: if answer to either of the following is "Yes", a more complete depression screening is indicated)   Over the past two weeks, have you felt down, depressed or hopeless? No  Over the past two weeks, have you felt little interest or pleasure in doing things? No Have you lost interest or pleasure in daily life? No Do you often feel hopeless? No Do you cry easily over simple problems? No   Activities of Daily Living  In your present state of health, do you have any difficulty performing the following activities?:   Driving? No  Managing money? No  Feeding yourself? No  Getting from bed to chair? No  Climbing a flight of stairs? No  Preparing food and eating?: No  Bathing or showering? No  Getting dressed: No  Getting to the toilet? No  Using the toilet:No  Moving around from place to place: No  In the past year have you fallen or had a near fall?:No  Are you sexually active? No  Do you have more than one partner? No   Hearing Difficulties: No  Do you often ask people to speak up or repeat themselves?  Yes Do you  experience ringing or noises in your ears? No  Do you have difficulty understanding soft or whispered voices?  Sometimes Do you feel that you have a problem with memory? No Do you often misplace items? No    Home Safety:  Do you have a smoke alarm at your residence? Yes Do you have grab bars in the bathroom?  Yes Do you have throw rugs in your house?  No   Cognitive Testing  Alert? Yes Normal Appearance?Yes  Oriented to person? Yes Place? Yes  Time? Yes  Recall of three objects? Yes  Can perform simple calculations? Yes  Displays appropriate judgment?Yes  Can read the correct time from a watch face?Yes   List the Names of Other Physician/Practitioners you currently use:  See referral list for the physicians patient is currently seeing.     Review of Systems: See above   Objective:     General appearance: Appears stated age and  obese  Head: Normocephalic, without obvious abnormality, atraumatic  Eyes: conj clear, EOMi PEERLA  Ears: normal TM's and external ear canals  both ears  Nose: Nares normal. Septum midline. Mucosa normal. No drainage or sinus tenderness.  Throat: lips, mucosa, and tongue normal; teeth and gums normal  Neck: no adenopathy, no carotid bruit, no JVD, supple, symmetrical, trachea midline and thyroid not enlarged, symmetric, no tenderness/mass/nodules  No CVA tenderness.  Lungs: clear to auscultation bilaterally  Breasts: normal appearance, no masses or tenderness Heart: regular rate and rhythm, S1, S2 normal, no murmur, click, rub or gallop  Abdomen: soft, non-tender; bowel sounds normal; no masses, no organomegaly  Musculoskeletal: ROM normal in all joints, no crepitus, no deformity, Normal muscle strengthen. Back  is symmetric, no curvature. Skin: Skin color, texture, turgor normal. No rashes or lesions  Lymph nodes: Cervical, supraclavicular, and axillary nodes normal.  Neurologic: CN 2 -12 Normal, Normal symmetric reflexes. Normal coordination and  gait  Psych: Alert & Oriented x 3, Mood appear stable.    Assessment:    Annual wellness medicare exam   Plan:    During the course of the visit the patient was educated and counseled about appropriate screening and preventive services including:   Has had annual flu vaccine  Declines tetanus immunization  Has declined pneumococcal vaccines     Patient Instructions (the written plan) was given to the patient.  Medicare Attestation  I have personally reviewed:  The patient's medical and social history  Their use of alcohol, tobacco or illicit drugs  Their current medications and supplements  The patient's functional ability including ADLs,fall risks, home safety risks, cognitive, and hearing and visual impairment  Diet and physical activities  Evidence for depression or mood disorders  The patient's weight, height, BMI, and visual acuity have been recorded in the chart. I have made referrals, counseling, and provided education to the patient based on review of the above and I have provided the patient with a written personalized care plan for preventive services.

## 2018-08-21 NOTE — Patient Instructions (Addendum)
Referral to endocrinology for evaluation and treatment of diabetes and hyperlipidemia.  Return here in 6 months.  Recommend bone density study.  Recommend Cologuard.  Pamphlet given.  Declines pneumococcal vaccines and tetanus immunization.

## 2018-09-07 ENCOUNTER — Encounter: Payer: Self-pay | Admitting: Internal Medicine

## 2018-09-07 NOTE — Patient Instructions (Addendum)
Lab draw only 

## 2018-09-19 ENCOUNTER — Other Ambulatory Visit: Payer: Self-pay | Admitting: Internal Medicine

## 2018-09-19 MED FILL — ONE TOUCH ULTRA TEST STRIPS: 25 days supply | Qty: 100 | Fill #0

## 2018-09-19 MED FILL — JANUVIA 100 MG TABLET: 100 | 30 days supply | Qty: 30 | Fill #1

## 2018-09-25 ENCOUNTER — Ambulatory Visit: Payer: Medicare Other | Admitting: Internal Medicine

## 2018-09-25 ENCOUNTER — Encounter: Payer: Self-pay | Admitting: Internal Medicine

## 2018-09-25 VITALS — BP 130/80 | HR 91 | Ht 63.0 in | Wt 172.0 lb

## 2018-09-25 DIAGNOSIS — E7800 Pure hypercholesterolemia, unspecified: Secondary | ICD-10-CM

## 2018-09-25 DIAGNOSIS — E1165 Type 2 diabetes mellitus with hyperglycemia: Secondary | ICD-10-CM | POA: Diagnosis not present

## 2018-09-25 MED ORDER — METFORMIN HCL ER 500 MG PO TB24: 1000.0000 mg | ORAL_TABLET | Freq: Every day | ORAL | 11 refills | Status: DC

## 2018-09-25 MED FILL — metFORMIN HCL ER 500 MG TB2: 500 | 30 days supply | Qty: 60 | Fill #0

## 2018-09-25 NOTE — Progress Notes (Signed)
HPI: Courtney Crosby is a 72 y.o.-year-old female, referred by her PCP, Dr. Renold Genta, for management of DM2, dx in ~2015, non-insulin-dependent, uncontrolled, without long-term complications.  Reviewed latest HbA1c level: Lab Results  Component Value Date   HGBA1C 7.8 (H) 08/20/2018   HGBA1C 7.5 (H) 03/06/2018   HGBA1C 8.0 (H) 01/02/2018   HGBA1C 7.9 (H) 11/10/2017   HGBA1C 7.7 (H) 09/26/2017   HGBA1C 8.3 (H) 08/07/2017   HGBA1C 7.8 (H) 01/31/2017   HGBA1C 7.4 (H) 08/02/2016   HGBA1C 7.4 (H) 11/17/2015   HGBA1C 7.6 (H) 07/27/2015   Pt is on a regimen of: - Januvia 100 mg before b'fast  - in 08/2017 >> soft, unformed stools constantly in the past - now formed stools every 2-3 day She did not try Metformin >> mother and aunt had CKD while on this.  Pt checks her sugars 1x a day and they are: - am: 144-215 - 2h after b'fast: n/c - before lunch: n/c - 2h after lunch: 133 - before dinner: n/c - 2h after dinner: 176 - bedtime: n/c - nighttime: 130-140's (not in log) Lowest sugar was 133; she has hypoglycemia awareness at 70.  Highest sugar was 215.  Glucometer: One Touch.  Pt's meals are: - Breakfast: Eggo waffle and PB, cinnamon, honey - Lunch: Eats out - chick fil-a - Dinner: Eats out or spaghetti - Snacks: no usually, occas. At night - frozen grapes, chocolate candy, apples, berries  - no CKD, last BUN/creatinine:  Lab Results  Component Value Date   BUN 14 08/20/2018   BUN 11 08/07/2017   CREATININE 0.73 08/20/2018   CREATININE 0.77 08/07/2017  On Lisinopril 10.  - + HL; last set of lipids: Lab Results  Component Value Date   CHOL 228 (H) 08/20/2018   HDL 42 (L) 08/20/2018   LDLCALC 158 (H) 08/20/2018   TRIG 150 (H) 08/20/2018   CHOLHDL 5.4 (H) 08/20/2018  Intolerant to many statins statins >> myalgias and weakness. Last tried: Crestor 3x a week >> not tolerating well. On Zetia >> did not tolerate.  - last eye exam was in 12/2017. No DR.   - no numbness  and tingling in her feet.  Pt has FH of DM in mother, MGM, aunts and uncles, cousins.  She has a history of HTN, distant history of melanoma - 20 years ago, seasonal depression during winter, lichen sclerosis, osteoarthritis of thumbs.  Pt is a retired Marine scientist.  ROS: Constitutional: no weight gain, no weight loss, + fatigue, no subjective hyperthermia, no subjective hypothermia, no nocturia Eyes: no blurry vision, no xerophthalmia ENT: no sore throat, no nodules palpated in neck, no dysphagia, no odynophagia, no hoarseness, no tinnitus, no hypoacusis Cardiovascular: no CP, no SOB, no palpitations, no leg swelling Respiratory: no cough, no SOB, no wheezing Gastrointestinal: no N, no V, + D, + C, + acid reflux Musculoskeletal: + Muscle aches, no joint aches Skin: no rash, no hair loss Neurological: no tremors, no numbness or tingling/no dizziness/no HAs Psychiatric: + Depression, no anxiety + Low libido  Past Medical History:  Diagnosis Date  . Arthritis   . Blood pressure elevated   . Cancer ([place])    melanoma  . Cholesterol serum elevated   . Diabetes mellitus   . H/O: cesarean section 1976 and 1978  . History of appendectomy age 36  . History of tonsillectomy 1965  . Hypercholesteremia 05/18/2011  . Hyperlipidemia   . Hypertension   . Melanoma ([place]) 05/18/2011   Past  Surgical History:  Procedure Laterality Date  . APPENDECTOMY   1958  . [place]  . TONSILLECTOMY  1965   Social History   Socioeconomic History  . Marital status: Married    Spouse name: Not on file  . Number of children: 2  . Years of education: Not on file  . Highest education level: Not on file  Occupational History  .  Retired Marine scientist  Tobacco Use  . Smoking status: Never Smoker  . Smokeless tobacco: Never Used  Substance and Sexual Activity  . Alcohol use: No  . Drug use: No   Current Outpatient Medications on File Prior to Visit  Medication Sig Dispense Refill  . blood  glucose meter kit and supplies KIT Dispense based on patient and insurance preference. Use up to four times daily as directed. (FOR ICD-9 250.00, 250.01). 1 each 0  . Clobetasol Prop Emollient Base (CLOBETASOL PROPIONATE E) 0.05 % emollient cream APPLY TO AFFECTED AREA EVERY DAY X 2-3 WEEKS,THEN EVERY OTHER DAY X2-3WEEKS,THEN TWICE PER WEEK 30 g 1  . glucose blood (ONE TOUCH ULTRA TEST) test strip USE UP TO FOUR TIMES DAILY AS DIRECTED. (FOR ICD-9 250.00, 250.01) 100 each 2  . JANUVIA 100 MG tablet TAKE 1 TABLET BY MOUTH DAILY. 90 tablet 0  . lisinopril (PRINIVIL,ZESTRIL) 10 MG tablet TAKE 1 TABLET BY MOUTH DAILY. 90 tablet 3  . naproxen sodium (ANAPROX) 220 MG tablet Take 220 mg by mouth 2 (two) times daily with a meal.     No current facility-administered medications on file prior to visit.    Allergies  Allergen Reactions  . Codeine   . Epinephrine Palpitations    Takes hours for reaction of increase pulse and BP.   Family History  Problem Relation Age of Onset  . Diabetes Mother   . Heart disease Mother   . Hypertension Mother   . Hypertension Brother   . Diabetes Maternal Grandmother   . Heart disease Maternal Grandmother   . Hypertension Maternal Grandmother   . Hypertension Maternal Grandfather   . Hypertension Paternal Grandmother   . Hypertension Paternal Grandfather     PE: BP 130/80   Pulse 91   Ht _0  (1.6 m)   Wt 172 lb (78 kg)   SpO2 97%   BMI 30.47 kg/m  Wt Readings from Last 3 Encounters:  08/21/18 173 lb (78.5 kg)  01/08/18 177 lb (80.3 kg)  11/13/17 177 lb (80.3 kg)   Constitutional: overweight, in NAD Eyes: PERRLA, EOMI, no exophthalmos ENT: moist mucous membranes, no thyromegaly, no cervical lymphadenopathy Cardiovascular: RRR, No MRG Respiratory: CTA B Gastrointestinal: abdomen soft, NT, ND, BS+ Musculoskeletal: no deformities, strength intact in all 4 Skin: moist, warm, no rashes Neurological: no tremor with outstretched hands, DTR normal in  all 4  ASSESSMENT: 1. DM2, non-insulin-dependent, uncontrolled, without long-term complications, but with hyperglycemia  2. HL  PLAN:  1. Patient with long-standing, uncontrolled diabetes, on oral antidiabetic regimen, which became insufficient.  Reviewing her sugars at home, they are higher than target in the morning and she only rarely checks later in the day. - We discussed at length about improving her diet which will help both her diabetes and her cholesterol.  I explained the mechanism of insulin resistance and how to improve it.  I recommended that she cut down high cholesterol foods from her diet to improve the insulin resistance and by extrapolation her diabetes and hyperlipidemia.  Given specific examples of  foods that she can eat.  I also advised her to start reducing the times that she eats out of the house, as she now eats most lunches and many dinners out. - For now, we also discussed about medication options for treatment to include metformin (ER, since she has GI problems), SGLT2 inhibitor (explained mechanism and possible side effects), sq and po GLP-1 receptor agonists.  She is now in the donut hole so many of these medicines are off limits.  We decided to try metformin ER at the low dose, taken all at night.  We will start with 1 tablet a day and advance to 2 tablets with dinner, if possible.  I explained that metformin does not usually cause kidney disease, however, if patient do have a decreased GFR, this can cause metformin to accumulate in the body. She does not have any kidney disease.  She agrees to try half maximal dose of metformin ER along with Januvia - I suggested to:  Patient Instructions  Please continue: - Januvia 100 mg before b'fast  Start: - Metformin ER 500 mg with dinner x 4-5 days, then increase to 1000 mg with dinner.  After you increase Metformin, start: - Red yeast rice 600 mg 2x a day    Please let me know if the sugars are consistently <80 or  >200.  Please come back for a follow-up appointment in 3 months.  - Strongly advised her to start checking sugars at different times of the day - check 1x a day, rotating checks - discussed about CBG targets for treatment: 80-130 mg/dL before meals and <180 mg/dL after meals; target HbA1c <7%. - given sugar log and advised how to fill it and to bring it at next appt  - given foot care handout and explained the principles  - given instructions for hypoglycemia management "15-15 rule"  - advised for yearly eye exams  - Return to clinic in 3 mo with sugar log   2. HL - latest LDL high, at 158, but prev. 101 (while on medication) - intolerant to all the tried statins 2/2 myalgias - tried Crestor 3x a week and still could not tolerate it - Tried Zetia also and could not tolerate - discussed the need to reduce cholesterol rich foods - given examples - will try to start red yeast rice.  I explained that this is a naturally occurring statin (lovastatin) that is over-the-counter.  I recommended that she tried 600 mg twice a day.  We will start this after she is on the target metformin dose, to make sure she tolerates this.  I advised her to reduce the dose to only once a day if she has any muscle aches, and to stop if they still occur after the dose reduction.  She may need PCSK9 inhibitors, but she refuses injections.  Philemon Kingdom, MD PhD [facility] Endocrinology

## 2018-09-25 NOTE — Patient Instructions (Addendum)
Please continue: - Januvia 100 mg before b'fast  Start: - Metformin ER 500 mg with dinner x 4-5 days, then increase to 1000 mg with dinner.  After you increase Metformin, start: - Red yeast rice 600 mg 2x a day    Please let me know if the sugars are consistently <80 or >200.  Please come back for a follow-up appointment in 3 months.  PATIENT INSTRUCTIONS FOR TYPE 2 DIABETES:  **Please join MyChart!** - see attached instructions about how to join if you have not done so already.  DIET AND EXERCISE Diet and exercise is an important part of diabetic treatment.  We recommended aerobic exercise in the form of brisk walking (working between 40-60% of maximal aerobic capacity, similar to brisk walking) for 150 minutes per week (such as 30 minutes five days per week) along with 3 times per week performing 'resistance' training (using various gauge rubber tubes with handles) 5-10 exercises involving the major muscle groups (upper body, lower body and core) performing 10-15 repetitions (or near fatigue) each exercise. Start at half the above goal but build slowly to reach the above goals. If limited by weight, joint pain, or disability, we recommend daily walking in a swimming pool with water up to waist to reduce pressure from joints while allow for adequate exercise.    BLOOD GLUCOSES Monitoring your blood glucoses is important for continued management of your diabetes. Please check your blood glucoses 2-4 times a day: fasting, before meals and at bedtime (you can rotate these measurements - e.g. one day check before the 3 meals, the next day check before 2 of the meals and before bedtime, etc.).   HYPOGLYCEMIA (low blood sugar) Hypoglycemia is usually a reaction to not eating, exercising, or taking too much insulin/ other diabetes drugs.  Symptoms include tremors, sweating, hunger, confusion, headache, etc. Treat IMMEDIATELY with 15 grams of Carbs: . 4 glucose tablets .  cup regular  juice/soda . 2 tablespoons raisins . 4 teaspoons sugar . 1 tablespoon honey Recheck blood glucose in 15 mins and repeat above if still symptomatic/blood glucose <100.  RECOMMENDATIONS TO REDUCE YOUR RISK OF DIABETIC COMPLICATIONS: * Take your prescribed MEDICATION(S) * Follow a DIABETIC diet: Complex carbs, fiber rich foods, (monounsaturated and polyunsaturated) fats * AVOID saturated/trans fats, high fat foods, >2,300 mg salt per day. * EXERCISE at least 5 times a week for 30 minutes or preferably daily.  * DO NOT SMOKE OR DRINK more than 1 drink a day. * Check your FEET every day. Do not wear tightfitting shoes. Contact us if you develop an ulcer * See your EYE doctor once a year or more if needed * Get a FLU shot once a year * Get a PNEUMONIA vaccine once before and once after age 66 years  GOALS:  * Your Hemoglobin A1c of <7%  * fasting sugars need to be <130 * after meals sugars need to be <180 (2h after you start eating) * Your Systolic BP should be 062 or lower  * Your Diastolic BP should be 80 or lower  * Your HDL (Good Cholesterol) should be 40 or higher  * Your LDL (Bad Cholesterol) should be 100 or lower. * Your Triglycerides should be 150 or lower  * Your Urine microalbumin (kidney function) should be <30 * Your Body Mass Index should be 25 or lower    Please consider the following ways to cut down carbs and fat and increase fiber and micronutrients in your diet: - substitute  whole grain for white bread or pasta - substitute brown rice for white rice - substitute 90-calorie flat bread pieces for slices of bread when possible - substitute sweet potatoes or yams for white potatoes - substitute humus for margarine - substitute tofu for cheese when possible - substitute almond or rice milk for regular milk (would not drink soy milk daily due to concern for soy estrogen influence on breast cancer risk) - substitute dark chocolate for other sweets when possible -  substitute water - can add lemon or orange slices for taste - for diet sodas (artificial sweeteners will trick your body that you can eat sweets without getting calories and will lead you to overeating and weight gain in the long run) - do not skip breakfast or other meals (this will slow down the metabolism and will result in more weight gain over time)  - can try smoothies made from fruit and almond/rice milk in am instead of regular breakfast - can also try old-fashioned (not instant) oatmeal made with almond/rice milk in am - order the dressing on the side when eating salad at a restaurant (pour less than half of the dressing on the salad) - eat as little meat as possible - can try juicing, but should not forget that juicing will get rid of the fiber, so would alternate with eating raw veg./fruits or drinking smoothies - use as little oil as possible, even when using olive oil - can dress a salad with a mix of balsamic vinegar and lemon juice, for e.g. - use agave nectar, stevia sugar, or regular sugar rather than artificial sweateners - steam or broil/roast veggies  - snack on veggies/fruit/nuts (unsalted, preferably) when possible, rather than processed foods - reduce or eliminate aspartame in diet (it is in diet sodas, chewing gum, etc) Read the labels!  Try to read Dr. Janene Harvey book: "Program for Reversing Diabetes" for other ideas for healthy eating.

## 2018-10-22 MED FILL — metFORMIN HCL ER 500 MG TB2: 500 | 30 days supply | Qty: 60 | Fill #1

## 2018-10-22 MED FILL — JANUVIA 100 MG TABLET: 100 | 30 days supply | Qty: 30 | Fill #2

## 2018-11-20 ENCOUNTER — Other Ambulatory Visit: Payer: Self-pay | Admitting: Internal Medicine

## 2018-11-20 MED FILL — JANUVIA 100 MG TABLET: 100 | 90 days supply | Qty: 90 | Fill #0

## 2018-11-26 ENCOUNTER — Other Ambulatory Visit: Payer: Self-pay | Admitting: Internal Medicine

## 2018-11-26 MED FILL — LISINOPRIL 10 MG TABS: 10 | 90 days supply | Qty: 90 | Fill #0

## 2018-11-26 MED FILL — metFORMIN HCL ER 500 MG TB2: 500 | 30 days supply | Qty: 60 | Fill #2

## 2018-11-26 MED FILL — ONE TOUCH ULTRA TEST STRIPS: 25 days supply | Qty: 100 | Fill #1 | Status: TO

## 2018-12-26 MED FILL — metFORMIN HCL ER 500 MG TB2: 500 | 30 days supply | Qty: 60 | Fill #3

## 2019-01-01 ENCOUNTER — Other Ambulatory Visit: Payer: Self-pay

## 2019-01-02 ENCOUNTER — Ambulatory Visit: Payer: Medicare Other | Admitting: Internal Medicine

## 2019-01-02 ENCOUNTER — Encounter: Payer: Self-pay | Admitting: Internal Medicine

## 2019-01-02 VITALS — BP 142/92 | HR 77 | Temp 97.9°F | Ht 63.0 in | Wt 163.0 lb

## 2019-01-02 DIAGNOSIS — E1165 Type 2 diabetes mellitus with hyperglycemia: Secondary | ICD-10-CM | POA: Diagnosis not present

## 2019-01-02 DIAGNOSIS — E7800 Pure hypercholesterolemia, unspecified: Secondary | ICD-10-CM | POA: Diagnosis not present

## 2019-01-02 LAB — POCT GLYCOSYLATED HEMOGLOBIN (HGB A1C): HEMOGLOBIN A1C: 6.1 % — AB (ref 4.0–5.6)

## 2019-01-02 MED ORDER — METFORMIN HCL ER 500 MG PO TB24: 1000.0000 mg | ORAL_TABLET | Freq: Every day | ORAL | 3 refills | Status: DC

## 2019-01-02 NOTE — Patient Instructions (Addendum)
Please continue: - Metformin ER 1000 mg with dinner  Stop: - Januvia  Try: - Red yeast rice 600 mg 2x a day    Please come back for a follow-up appointment in 4 months.

## 2019-01-02 NOTE — Progress Notes (Signed)
HPI: Courtney Crosby is a 73 y.o.-year-old female, returning for follow-up for DM2, dx in ~2015, non-insulin-dependent, uncontrolled, without long-term complications.  Last visit 3 months ago.  Reviewed HbA1c levels: Lab Results  Component Value Date   HGBA1C 7.8 (H) 08/20/2018   HGBA1C 7.5 (H) 03/06/2018   HGBA1C 8.0 (H) 01/02/2018   HGBA1C 7.9 (H) 11/10/2017   HGBA1C 7.7 (H) 09/26/2017   HGBA1C 8.3 (H) 08/07/2017   HGBA1C 7.8 (H) 01/31/2017   HGBA1C 7.4 (H) 08/02/2016   HGBA1C 7.4 (H) 11/17/2015   HGBA1C 7.6 (H) 07/27/2015   Pt is on a regimen of: - Metformin ER 1000 mg with dinner -started 09/2018 - initially diarrhea, then resolved - Januvia 100 mg before b'fast  - in 08/2017 -initially had soft stools with this She did not try Metformin >> mother and aunt had CKD while on this.  Pt checks her sugars once a day: - am: 144-215 >> 109-127, 135 - 2h after b'fast: n/c >> 114-134 - before lunch: n/c >> 104-137 - 2h after lunch: 133 >> 116-132 - before dinner: n/c >> 99-140 (snack) - 2h after dinner: 176 >> 99-147, 162 - bedtime: n/c >> 105-120 - nighttime: 130-140's >> 113 Lowest sugar was 133 >> 99; she has hypoglycemia awareness in the 70s.  Highest sugar was 215 >> 172 after dinner - at bedtime.  Glucometer: One Touch.  Pt's meals are: - Breakfast: Eggo waffle and PB, cinnamon, honey - Lunch: Eats out - chick fil-a - Dinner: Eats out or spaghetti - Snacks: no usually, occas. At night - frozen grapes, chocolate candy, apples, berries Drinks green tea, stopped all diet sodas.  -No CKD, last BUN/creatinine:  Lab Results  Component Value Date   BUN 14 08/20/2018   BUN 11 08/07/2017   CREATININE 0.73 08/20/2018   CREATININE 0.77 08/07/2017  On lisinopril 10.  -+ HL; last set of lipids: Lab Results  Component Value Date   CHOL 228 (H) 08/20/2018   HDL 42 (L) 08/20/2018   LDLCALC 158 (H) 08/20/2018   TRIG 150 (H) 08/20/2018   CHOLHDL 5.4 (H) 08/20/2018  She is  intolerant to several statins: Myalgias and weakness. Tried Crestor 3 times a week but could not tolerate it. She could not tolerate Zetia. She did not try Red Yeast Rice yet.  - last eye exam was in 12/2017: No DR  -She denies numbness and tingling in her feet.  Pt has FH of DM in mother, MGM, aunts and uncles, cousins.  She has a history of HTN, distant history of melanoma -20 years ago, seasonal depression during winter, lichen sclerosis, osteoarthritis of thumbs.  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: no weight gain/no weight loss, no fatigue, no subjective hyperthermia, no subjective hypothermia Eyes: no blurry vision, no xerophthalmia ENT: no sore throat, no nodules palpated in neck, no dysphagia, no odynophagia, no hoarseness Cardiovascular: no CP/no SOB/no palpitations/no leg swelling Respiratory: no cough/no SOB/no wheezing Gastrointestinal: no N/no V/no D/no C/no acid reflux Musculoskeletal: no muscle aches/no joint aches Skin: no rashes, no hair loss Neurological: no tremors/no numbness/no tingling/no dizziness  I reviewed pt's medications, allergies, PMH, social hx, family hx, and changes were documented in the history of present illness. Otherwise, unchanged from my initial visit note.  Past Medical History:  Diagnosis Date  . Arthritis   . Blood pressure elevated   . Cancer ([place])    melanoma  . Cholesterol serum elevated   . Diabetes mellitus   .  H/O: cesarean section 1976 and 1978  . History of appendectomy age 43  . History of tonsillectomy 1965  . Hypercholesteremia 05/18/2011  . Hyperlipidemia   . Hypertension   . Melanoma ([place]) 05/18/2011   Past Surgical History:  Procedure Laterality Date  . APPENDECTOMY   1958  .   . TONSILLECTOMY  1965   Social History   Socioeconomic History  . Marital status: Married    Spouse name: Not on file  . Number of children: 2  . Years of education: Not on file  . Highest  education level: Not on file  Occupational History  .  Retired Marine scientist  Tobacco Use  . Smoking status: Never Smoker  . Smokeless tobacco: Never Used  Substance and Sexual Activity  . Alcohol use: No  . Drug use: No   Current Outpatient Medications on File Prior to Visit  Medication Sig Dispense Refill  . blood glucose meter kit and supplies KIT Dispense based on patient and insurance preference. Use up to four times daily as directed. (FOR ICD-9 250.00, 250.01). 1 each 0  . Clobetasol Prop Emollient Base (CLOBETASOL PROPIONATE E) 0.05 % emollient cream APPLY TO AFFECTED AREA EVERY DAY X 2-3 WEEKS,THEN EVERY OTHER DAY X2-3WEEKS,THEN TWICE PER WEEK 30 g 1  . glucose blood (ONE TOUCH ULTRA TEST) test strip USE UP TO FOUR TIMES DAILY AS DIRECTED. (FOR ICD-9 250.00, 250.01) 100 each 2  . JANUVIA 100 MG tablet TAKE 1 TABLET BY MOUTH DAILY. 90 tablet 0  . lisinopril (PRINIVIL,ZESTRIL) 10 MG tablet TAKE 1 TABLET BY MOUTH DAILY. 90 tablet 3  . metFORMIN (GLUCOPHAGE-XR) 500 MG 24 hr tablet Take 2 tablets (1,000 mg total) by mouth daily with supper. 60 tablet 11  . naproxen sodium (ANAPROX) 220 MG tablet Take 220 mg by mouth 2 (two) times daily with a meal.     No current facility-administered medications on file prior to visit.    Allergies  Allergen Reactions  . Codeine   . Epinephrine Palpitations    Takes hours for reaction of increase pulse and BP.   Family History  Problem Relation Age of Onset  . Diabetes Mother   . Heart disease Mother   . Hypertension Mother   . Hypertension Brother   . Diabetes Maternal Grandmother   . Heart disease Maternal Grandmother   . Hypertension Maternal Grandmother   . Hypertension Maternal Grandfather   . Hypertension Paternal Grandmother   . Hypertension Paternal Grandfather     PE: BP (!) 142/92   Pulse 77   Temp 97.9 F (36.6 C)   Ht _0  (1.6 m)   Wt 163 lb (73.9 kg)   SpO2 98%   BMI 28.87 kg/m  Wt Readings from Last 3 Encounters:   01/02/19 163 lb (73.9 kg)  09/25/18 172 lb (78 kg)  08/21/18 173 lb (78.5 kg)   Constitutional: overweight, in NAD Eyes: PERRLA, EOMI, no exophthalmos ENT: moist mucous membranes, no thyromegaly, no cervical lymphadenopathy Cardiovascular: RRR, No MRG Respiratory: CTA B Gastrointestinal: abdomen soft, NT, ND, BS+ Musculoskeletal: no deformities, strength intact in all 4 Skin: moist, warm, no rashes Neurological: no tremor with outstretched hands, DTR normal in all 4  ASSESSMENT: 1. DM2, non-insulin-dependent, uncontrolled, without long-term complications, but with hyperglycemia  2. HL  PLAN:  1. Patient with longstanding, uncontrolled, type 2 diabetes, on oral antidiabetic regimen, with DPP 4 inhibitor and also now metformin, added at last visit.  At that  time, sugars were higher than target in the morning but she was not checking CBGs later in the day.  I advised her to rotate the checks.  At that time, we discussed at length about improving her diet which can help both her diabetes and her cholesterol.  We discussed about mechanisms of insulin resistance and how to improve it. -At last visit, we also discussed about SGLT2 inhibitors and sq n.p.o. GLP-1 receptor agonist.  She was in the donut hole and these medications were not affordable. -At this visit, sugars are excellent and she is interested in coming off Januvia (expensive), which I think we can do now.  I advised her to restart the medication if sugars start to increase significantly.  Otherwise, we will just continue metformin for now.  She would also be interested in coming off this medication and we may do this in the near future. - I suggested to:  Patient Instructions  Please continue: - Metformin ER 1000 mg with dinner  Stop: - Januvia  Try: - Red yeast rice 600 mg 2x a day    Please come back for a follow-up appointment in 4 months.  - today, HbA1c is 6.1% (much better) - continue checking sugars at different  times of the day - check 1x a day, rotating checks - advised for yearly eye exams >> she is UTD - Return to clinic in 4 mo with sugar log   2. HL - Reviewed latest lipid panel from 08/2018: All fractions abnormal. Lab Results  Component Value Date   CHOL 228 (H) 08/20/2018   HDL 42 (L) 08/20/2018   LDLCALC 158 (H) 08/20/2018   TRIG 150 (H) 08/20/2018   CHOLHDL 5.4 (H) 08/20/2018  -She tried several statins and she could not tolerate them due to myalgias.  She also tried Crestor 3 times a week and Zetia and could not tolerate it. She may need PCSK9 inhibitors, however, she refuses injectables... -At last visit, discussed about reducing cholesterol rich foods and given examples -At that time, I suggested to start red yeast rice (lovastatin).  She did not do so.  At this visit, I explained the multiple benefits of using a statin and she agrees to at least try the red yeast rice.  Philemon Kingdom, MD PhD [facility] Endocrinology

## 2019-01-22 ENCOUNTER — Other Ambulatory Visit: Payer: Self-pay | Admitting: Internal Medicine

## 2019-01-22 MED FILL — ONE TOUCH ULTRA TEST STRIPS: 25 days supply | Qty: 100 | Fill #0

## 2019-01-22 MED FILL — metFORMIN HCL ER 500 MG TB2: 500 | 90 days supply | Qty: 180 | Fill #0

## 2019-02-11 LAB — HM DIABETES EYE EXAM

## 2019-02-22 ENCOUNTER — Encounter: Payer: Self-pay | Admitting: Internal Medicine

## 2019-02-26 ENCOUNTER — Other Ambulatory Visit: Payer: Medicare Other | Admitting: Internal Medicine

## 2019-02-26 ENCOUNTER — Other Ambulatory Visit: Payer: Self-pay

## 2019-02-26 ENCOUNTER — Encounter: Payer: Self-pay | Admitting: Internal Medicine

## 2019-02-26 VITALS — BP 130/90 | HR 62 | Temp 98.4°F

## 2019-02-26 DIAGNOSIS — E119 Type 2 diabetes mellitus without complications: Secondary | ICD-10-CM

## 2019-02-26 DIAGNOSIS — E782 Mixed hyperlipidemia: Secondary | ICD-10-CM

## 2019-02-27 LAB — LIPID PANEL
Cholesterol: 218 mg/dL — ABNORMAL HIGH (ref <200)
HDL: 41 mg/dL — ABNORMAL LOW (ref >=50)
LDL Cholesterol (Calc): 144 mg/dL (calc) — ABNORMAL HIGH
Non-HDL Cholesterol (Calc): 177 mg/dL (calc) — ABNORMAL HIGH (ref <130)
Total CHOL/HDL Ratio: 5.3 (calc) — ABNORMAL HIGH (ref <5.0)
Triglycerides: 194 mg/dL — ABNORMAL HIGH (ref <150)

## 2019-02-27 LAB — HEMOGLOBIN A1C
Hgb A1c MFr Bld: 6.4 % of total Hgb — ABNORMAL HIGH (ref <5.7)
Mean Plasma Glucose: 137 (calc)
eAG (mmol/L): 7.6 (calc)

## 2019-02-28 ENCOUNTER — Other Ambulatory Visit: Payer: Self-pay

## 2019-02-28 ENCOUNTER — Ambulatory Visit (INDEPENDENT_AMBULATORY_CARE_PROVIDER_SITE_OTHER): Payer: Medicare Other | Admitting: Internal Medicine

## 2019-02-28 DIAGNOSIS — K580 Irritable bowel syndrome with diarrhea: Secondary | ICD-10-CM

## 2019-02-28 DIAGNOSIS — E119 Type 2 diabetes mellitus without complications: Secondary | ICD-10-CM

## 2019-02-28 DIAGNOSIS — E782 Mixed hyperlipidemia: Secondary | ICD-10-CM | POA: Diagnosis not present

## 2019-02-28 DIAGNOSIS — Z6830 Body mass index (BMI) 30.0-30.9, adult: Secondary | ICD-10-CM | POA: Diagnosis not present

## 2019-02-28 MED ORDER — CLOBETASOL PROP EMOLLIENT BASE 0.05 % EX CREA: TOPICAL_CREAM | CUTANEOUS | 1 refills | Status: DC

## 2019-02-28 NOTE — Progress Notes (Signed)
   Subjective:    Patient ID: Courtney Crosby, female    DOB: 30-Oct-1945, 73 y.o.   MRN: [id number]  HPI 73 year old Female with history of essential hypertension, type 2 diabetes mellitus intolerant of Januvia currently on metformin, metabolic syndrome, hyperlipidemia in for 12-month recheck.  Due to the coronavirus pandemic she is seen by interactive audio and video telecommunications today.  He consents to visit in this format.  She is identified using 2 identifiers as Courtney Crosby, a patient in this practice.  Has been referred to endocrinologist, Dr. Cruzita Lederer and has follow-up appointment there in July.  She has been maintained on extended release Metformin.  Complains of diarrhea.  Offered to try something different but she does not want to try anything different.  Endocrinologist recommended red rice yeast but patient has yet to try it.    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  She has lost some weight.  Blood pressure has been 101/7010 was 124/77 respectively.  Tends to have white coat syndrome with blood pressure readings.       Assessment & Plan:  Type 2 diabetes mellitus  Mixed hyperlipidemia  Obesity  Metabolic syndrome  Diarrhea-?  Irritable bowel syndrome versus Metformin induced  Plan: She only wants to try red rice yeast at this point in time in addition to her metformin.  Does not want medication for diarrhea which could be irritable bowel syndrome.  She will follow-up with endocrinologist in July.  Her physical exam is due here and November 2020.  She will be contacted about an appointment and follow-up lab studies.  Encouraged to walk more and continue calorie restricted and low-fat diet.  25 minutes spent with patient on virtual visit in addition to reviewing records and making medical decisions regarding her care.

## 2019-03-10 ENCOUNTER — Encounter: Payer: Self-pay | Admitting: Internal Medicine

## 2019-03-10 NOTE — Patient Instructions (Signed)
Patient wants to try red rice yeast for hyperlipidemia.  She will follow-up with endocrinologist in July.  Physical exam due here in November 2020.

## 2019-04-01 ENCOUNTER — Other Ambulatory Visit: Payer: Self-pay | Admitting: Internal Medicine

## 2019-04-01 DIAGNOSIS — Z1231 Encounter for screening mammogram for malignant neoplasm of breast: Secondary | ICD-10-CM

## 2019-04-22 ENCOUNTER — Other Ambulatory Visit: Payer: Self-pay

## 2019-04-24 ENCOUNTER — Ambulatory Visit: Payer: Medicare Other | Admitting: Internal Medicine

## 2019-04-24 ENCOUNTER — Other Ambulatory Visit: Payer: Self-pay

## 2019-04-24 ENCOUNTER — Encounter: Payer: Self-pay | Admitting: Internal Medicine

## 2019-04-24 VITALS — BP 120/70 | HR 76 | Ht 63.0 in | Wt 158.0 lb

## 2019-04-24 DIAGNOSIS — E1165 Type 2 diabetes mellitus with hyperglycemia: Secondary | ICD-10-CM | POA: Diagnosis not present

## 2019-04-24 DIAGNOSIS — E663 Overweight: Secondary | ICD-10-CM

## 2019-04-24 DIAGNOSIS — E7800 Pure hypercholesterolemia, unspecified: Secondary | ICD-10-CM

## 2019-04-24 NOTE — Progress Notes (Signed)
HPI: Courtney Crosby is a 73 y.o.-year-old female, returning for follow-up for DM2, dx in ~2015, non-insulin-dependent, uncontrolled, without long-term complications.  Last visit 4 months ago.  Reviewed HbA1c levels: Lab Results  Component Value Date   HGBA1C 6.4 (H) 02/26/2019   HGBA1C 6.1 (A) 01/02/2019   HGBA1C 7.8 (H) 08/20/2018   HGBA1C 7.5 (H) 03/06/2018   HGBA1C 8.0 (H) 01/02/2018   HGBA1C 7.9 (H) 11/10/2017   HGBA1C 7.7 (H) 09/26/2017   HGBA1C 8.3 (H) 08/07/2017   HGBA1C 7.8 (H) 01/31/2017   HGBA1C 7.4 (H) 08/02/2016   Pt is on a regimen of: - Metformin ER 1000 mg with dinner -started 09/2018 - initially diarrhea - now stool incontinence We stopped Januvia 100 mg before b'fast 2/2 $$$-initially had soft stools with this, then they resolved She did not try Metformin >> mother and aunt had CKD while on this.  Pt checks her sugars 1x a day -  - am: 144-215 >> 109-127, 135 >> 113-134, 140, 154 - 2h after b'fast: n/c >> 114-134 >> n/c - before lunch: n/c >> 104-137 >> 93-116, 136 - 2h after lunch: 133 >> 116-132 >> 130, 133 - before dinner: n/c >> 99-140 (snack) >> 79-133, 162, 166 - 2h after dinner: 176 >> 99-147, 162 >> 109-133, 154 - bedtime: n/c >> 105-120 >> 97-127, 138 - nighttime: 130-140's >> 113 Lowest sugar was 133 >> 99 >> 79; she has hypoglycemia awareness in the 70s.  Highest sugar was 215 >> 172 after dinner - at bedtime >> 166.  Glucometer: One Touch.  Pt's meals are: - Breakfast: Eggo waffle and PB, cinnamon, honey - Lunch: Eats out - chick fil-a - Dinner: Eats out or spaghetti - Snacks: no usually, occas. At night - frozen grapes, chocolate candy, apples, berries Stopped sodas.  -No CKD, last BUN/creatinine:  Lab Results  Component Value Date   BUN 14 08/20/2018   BUN 11 08/07/2017   CREATININE 0.73 08/20/2018   CREATININE 0.77 08/07/2017  On Lisinopril.  -+ HL; last set of lipids: Lab Results  Component Value Date   CHOL 218 (H) 02/26/2019    CHOL 228 (H) 08/20/2018   CHOL 173 11/10/2017   Lab Results  Component Value Date   HDL 41 (L) 02/26/2019   HDL 42 (L) 08/20/2018   HDL 47 (L) 11/10/2017   Lab Results  Component Value Date   LDLCALC 144 (H) 02/26/2019   LDLCALC 158 (H) 08/20/2018   LDLCALC 101 (H) 11/10/2017   Lab Results  Component Value Date   TRIG 194 (H) 02/26/2019   TRIG 150 (H) 08/20/2018   TRIG 150 (H) 11/10/2017  Intolerant to statins: myalgia and weakness. Tried Crestor 3 times a week but could not tolerate it. She could not tolerate Zetia.  She tried [employer] x1 mo in 02/2019 >> loose stools and stool incontinence >> stopped.  - last eye exam was in 02/2019: No DR  -She denies numbness and tingling in her feet.  Pt has FH of DM in mother, MGM, aunts and uncles, cousins.  She has a history of HTN, distant history of melanoma -> 20 years ago, seasonal depression during winter, lichen sclerosis, osteoarthritis of thumbs.  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: no weight gain/no weight loss, no fatigue, no subjective hyperthermia, no subjective hypothermia Eyes: no blurry vision, no xerophthalmia ENT: no sore throat, no nodules palpated in neck, no dysphagia, no odynophagia, no hoarseness Cardiovascular: no CP/no SOB/no palpitations/no leg  swelling Respiratory: no cough/no SOB/no wheezing Gastrointestinal: no N/no V/+ D/no C/no acid reflux Musculoskeletal: no muscle aches/no joint aches Skin: no rashes, no hair loss Neurological: no tremors/no numbness/no tingling/no dizziness  I reviewed pt's medications, allergies, PMH, social hx, family hx, and changes were documented in the history of present illness. Otherwise, unchanged from my initial visit note.  Past Medical History:  Diagnosis Date  . Arthritis   . Blood pressure elevated   . Cancer ([place])    melanoma  . Cholesterol serum elevated   . Diabetes mellitus   . H/O: cesarean section 1976 and 1978  . History of  appendectomy age 54  . History of tonsillectomy 1965  . Hypercholesteremia 05/18/2011  . Hyperlipidemia   . Hypertension   . Melanoma ([place]) 05/18/2011   Past Surgical History:  Procedure Laterality Date  . APPENDECTOMY   1958  . [place]  . TONSILLECTOMY  1965   Social History   Socioeconomic History  . Marital status: Married    Spouse name: Not on file  . Number of children: 2  . Years of education: Not on file  . Highest education level: Not on file  Occupational History  .  Retired Marine scientist  Tobacco Use  . Smoking status: Never Smoker  . Smokeless tobacco: Never Used  Substance and Sexual Activity  . Alcohol use: No  . Drug use: No   Current Outpatient Medications on File Prior to Visit  Medication Sig Dispense Refill  . blood glucose meter kit and supplies KIT Dispense based on patient and insurance preference. Use up to four times daily as directed. (FOR ICD-9 250.00, 250.01). 1 each 0  . Clobetasol Prop Emollient Base (CLOBETASOL PROPIONATE E) 0.05 % emollient cream APPLY TO AFFECTED AREA EVERY DAY X 2-3 WEEKS,THEN EVERY OTHER DAY X2-3WEEKS,THEN TWICE PER WEEK 30 g 1  . glucose blood (ONE TOUCH ULTRA TEST) test strip USE UP TO FOUR TIMES DAILY AS DIRECTED. 100 each 0  . lisinopril (PRINIVIL,ZESTRIL) 10 MG tablet TAKE 1 TABLET BY MOUTH DAILY. 90 tablet 3  . metFORMIN (GLUCOPHAGE-XR) 500 MG 24 hr tablet Take 2 tablets (1,000 mg total) by mouth daily with supper. 180 tablet 3  . naproxen sodium (ANAPROX) 220 MG tablet Take 220 mg by mouth 2 (two) times daily with a meal.     No current facility-administered medications on file prior to visit.    Allergies  Allergen Reactions  . Codeine   . Epinephrine Palpitations    Takes hours for reaction of increase pulse and BP.   Family History  Problem Relation Age of Onset  . Diabetes Mother   . Heart disease Mother   . Hypertension Mother   . Hypertension Brother   . Diabetes Maternal Grandmother   .  Heart disease Maternal Grandmother   . Hypertension Maternal Grandmother   . Hypertension Maternal Grandfather   . Hypertension Paternal Grandmother   . Hypertension Paternal Grandfather     PE: BP 120/70   Pulse 76   Ht '5\' 3"'  (1.6 m)   Wt 158 lb (71.7 kg)   SpO2 98%   BMI 27.99 kg/m  Wt Readings from Last 3 Encounters:  04/24/19 158 lb (71.7 kg)  01/02/19 163 lb (73.9 kg)  09/25/18 172 lb (78 kg)   Constitutional: slightly overweight, in NAD Eyes: PERRLA, EOMI, no exophthalmos ENT: moist mucous membranes, no thyromegaly, no cervical lymphadenopathy Cardiovascular: RRR, No MRG Respiratory: CTA B Gastrointestinal: abdomen soft,  NT, ND, BS+ Musculoskeletal: no deformities, strength intact in all 4 Skin: moist, warm, no rashes Neurological: no tremor with outstretched hands, DTR normal in all 4  ASSESSMENT: 1. DM2, non-insulin-dependent, uncontrolled, without long-term complications, but with hyperglycemia  2. HL  3.  Overweight  PLAN:  1. Patient with longstanding, uncontrolled, type 2 diabetes, on oral antidiabetic regimen, with metformin ER only.  At last visit, we had to stop Januvia due to price, however, her A1c improved significantly at that time to 6.1%.  Since then, she had an HbA1c with PCP that was 6.4%, slightly higher.  However, this is still well within range for her age. -At this visit, per review of her log, her sugars are mostly at goal, with 3 exceptions, after dietary indiscretions. -I do not feel that she needs a change in regimen, however, due to her stool incontinence/diarrhea, I advised her to split metformin into 2 doses.  If GI symptoms do not improve, she may need to reduce the dose of metformin to only 1 tablet with dinner. - I suggested to:  Patient Instructions  Please try to split: - Metformin ER 500 mg 2x a day with meals  Try to restart the Red Yeast Rice 600 mg 2x a day if the diarrhea resolves.  Please return in 4 months with your sugar  log.   - advised to check sugars at different times of the day - 1x a day, rotating check times - advised for yearly eye exams >> she is UTD - return to clinic in 4 months  2. HL - Reviewed latest lipid panel from 02/2019: All fractions abnormal Lab Results  Component Value Date   CHOL 218 (H) 02/26/2019   HDL 41 (L) 02/26/2019   LDLCALC 144 (H) 02/26/2019   TRIG 194 (H) 02/26/2019   CHOLHDL 5.3 (H) 02/26/2019  -She tried statins but could not tolerate them due to myalgias.  She also tried Crestor 3 times a week and Zetia and could not tolerate these.  She refused injectable medications for her hyperlipidemia.  She also refuses to retry statins.  At last visit I suggested red yeast rice.  At last visit with PCP in 02/2019, she did not start this yet, but she did afterwards.  She had loose stools and stool incontinence and stopped after a month.  However, we discussed that this could have been due to metformin and we will adjust the dose of metformin at this visit.  I advised her that if her stool incontinence improves, to restart red yeast rice.  She agrees.  2.  Overweight -Lost 9 pounds before last visit -Lost 5 pounds since last visit -She would like to lose 8 more pounds -She continues to work on her diet  Philemon Kingdom, MD PhD [facility] Endocrinology

## 2019-04-24 NOTE — Patient Instructions (Addendum)
Please try to split: - Metformin ER 500 mg 2x a day with meals  Continue: - Januvia 100 mg daily in am  Try to restart the Red Yeast Rice 600 mg 2x a day if the diarrhea resolves.  Please return in 4 months with your sugar log.

## 2019-04-25 MED FILL — metFORMIN HCL ER 500 MG TB2: 500 | 90 days supply | Qty: 180 | Fill #0

## 2019-04-25 MED FILL — ONE TOUCH ULTRA TEST STRIPS: 25 days supply | Qty: 100 | Fill #0

## 2019-04-25 MED FILL — LISINOPRIL 10 MG TABS: 10 | 90 days supply | Qty: 90 | Fill #1

## 2019-05-17 ENCOUNTER — Other Ambulatory Visit: Payer: Self-pay

## 2019-05-17 ENCOUNTER — Ambulatory Visit
Admission: RE | Admit: 2019-05-17 | Discharge: 2019-05-17 | Disposition: A | Discharge status: Home / Self Care | Payer: Medicare Other | Source: Ambulatory Visit | Attending: Internal Medicine | Admitting: Internal Medicine

## 2019-05-17 ENCOUNTER — Ambulatory Visit: Payer: Medicare Other

## 2019-05-17 DIAGNOSIS — Z1231 Encounter for screening mammogram for malignant neoplasm of breast: Secondary | ICD-10-CM

## 2019-06-28 ENCOUNTER — Other Ambulatory Visit: Payer: Self-pay | Admitting: Internal Medicine

## 2019-06-28 MED FILL — ONE TOUCH ULTRA TEST STRIPS: 25 days supply | Qty: 100 | Fill #0

## 2019-07-29 ENCOUNTER — Other Ambulatory Visit: Payer: Self-pay | Admitting: Internal Medicine

## 2019-07-29 MED FILL — LISINOPRIL 10 MG TABS: 10 | 90 days supply | Qty: 90 | Fill #2

## 2019-07-29 MED FILL — METFORMIN HCL ER 500 MG TB2: 500 | 90 days supply | Qty: 180 | Fill #1

## 2019-07-29 MED FILL — ONE TOUCH ULTRA TEST STRIPS: 25 days supply | Qty: 100 | Fill #0

## 2019-08-22 ENCOUNTER — Other Ambulatory Visit: Payer: Self-pay

## 2019-08-23 ENCOUNTER — Other Ambulatory Visit: Payer: Medicare Other | Admitting: Internal Medicine

## 2019-08-23 ENCOUNTER — Other Ambulatory Visit: Payer: Self-pay

## 2019-08-23 DIAGNOSIS — E782 Mixed hyperlipidemia: Secondary | ICD-10-CM

## 2019-08-23 DIAGNOSIS — E2839 Other primary ovarian failure: Secondary | ICD-10-CM

## 2019-08-23 DIAGNOSIS — E8881 Metabolic syndrome: Secondary | ICD-10-CM

## 2019-08-23 DIAGNOSIS — M8580 Other specified disorders of bone density and structure, unspecified site: Secondary | ICD-10-CM

## 2019-08-23 DIAGNOSIS — Z0000 Encounter for general adult medical examination without abnormal findings: Secondary | ICD-10-CM

## 2019-08-23 DIAGNOSIS — K580 Irritable bowel syndrome with diarrhea: Secondary | ICD-10-CM

## 2019-08-23 DIAGNOSIS — E119 Type 2 diabetes mellitus without complications: Secondary | ICD-10-CM

## 2019-08-23 DIAGNOSIS — Z1329 Encounter for screening for other suspected endocrine disorder: Secondary | ICD-10-CM

## 2019-08-23 DIAGNOSIS — K219 Gastro-esophageal reflux disease without esophagitis: Secondary | ICD-10-CM

## 2019-08-24 LAB — CBC WITH DIFFERENTIAL/PLATELET
Absolute Monocytes: 643 cells/uL (ref 200–950)
Basophils Absolute: 60 cells/uL (ref 0–200)
Basophils Relative: 0.9 %
Eosinophils Absolute: 0 cells/uL — ABNORMAL LOW (ref 15–500)
Eosinophils Relative: 0 %
HCT: 46.2 % — ABNORMAL HIGH (ref 35.0–45.0)
Hemoglobin: 15.5 g/dL (ref 11.7–15.5)
Lymphs Abs: 2332 cells/uL (ref 850–3900)
MCH: 30 pg (ref 27.0–33.0)
MCHC: 33.5 g/dL (ref 32.0–36.0)
MCV: 89.5 fL (ref 80.0–100.0)
MPV: 12.6 fL — ABNORMAL HIGH (ref 7.5–12.5)
Monocytes Relative: 9.6 %
Neutro Abs: 3665 cells/uL (ref 1500–7800)
Neutrophils Relative %: 54.7 %
Platelets: 214 10*3/uL (ref 140–400)
RBC: 5.16 10*6/uL — ABNORMAL HIGH (ref 3.80–5.10)
RDW: 12.5 % (ref 11.0–15.0)
Total Lymphocyte: 34.8 %
WBC: 6.7 10*3/uL (ref 3.8–10.8)

## 2019-08-24 LAB — HEMOGLOBIN A1C
Hgb A1c MFr Bld: 6.4 % of total Hgb — ABNORMAL HIGH (ref <5.7)
Mean Plasma Glucose: 137 (calc)
eAG (mmol/L): 7.6 (calc)

## 2019-08-24 LAB — COMPLETE METABOLIC PANEL WITH GFR
AG Ratio: 1.8 (calc) (ref 1.0–2.5)
ALT: 16 U/L (ref 6–29)
AST: 16 U/L (ref 10–35)
Albumin: 4.4 g/dL (ref 3.6–5.1)
Alkaline phosphatase (APISO): 48 U/L (ref 37–153)
BUN: 11 mg/dL (ref 7–25)
CO2: 29 mmol/L (ref 20–32)
Calcium: 10.1 mg/dL (ref 8.6–10.4)
Chloride: 105 mmol/L (ref 98–110)
Creat: 0.69 mg/dL (ref 0.60–0.93)
GFR, Est African American: 99 mL/min/{1.73_m2} (ref >=60)
GFR, Est Non African American: 85 mL/min/{1.73_m2} (ref >=60)
Globulin: 2.4 g/dL (calc) (ref 1.9–3.7)
Glucose, Bld: 124 mg/dL — ABNORMAL HIGH (ref 65–99)
Potassium: 5.4 mmol/L — ABNORMAL HIGH (ref 3.5–5.3)
Sodium: 141 mmol/L (ref 135–146)
Total Bilirubin: 0.4 mg/dL (ref 0.2–1.2)
Total Protein: 6.8 g/dL (ref 6.1–8.1)

## 2019-08-24 LAB — LIPID PANEL
Cholesterol: 221 mg/dL — ABNORMAL HIGH (ref <200)
HDL: 45 mg/dL — ABNORMAL LOW (ref >=50)
LDL Cholesterol (Calc): 141 mg/dL (calc) — ABNORMAL HIGH
Non-HDL Cholesterol (Calc): 176 mg/dL (calc) — ABNORMAL HIGH (ref <130)
Total CHOL/HDL Ratio: 4.9 (calc) (ref <5.0)
Triglycerides: 213 mg/dL — ABNORMAL HIGH (ref <150)

## 2019-08-24 LAB — TSH: TSH: 1.64 mIU/L (ref 0.40–4.50)

## 2019-08-26 ENCOUNTER — Encounter: Payer: Self-pay | Admitting: Internal Medicine

## 2019-08-26 ENCOUNTER — Ambulatory Visit: Payer: Medicare Other | Admitting: Internal Medicine

## 2019-08-26 VITALS — BP 138/90 | HR 88 | Ht 63.0 in | Wt 153.0 lb

## 2019-08-26 DIAGNOSIS — E663 Overweight: Secondary | ICD-10-CM | POA: Diagnosis not present

## 2019-08-26 DIAGNOSIS — E1165 Type 2 diabetes mellitus with hyperglycemia: Secondary | ICD-10-CM

## 2019-08-26 DIAGNOSIS — E7800 Pure hypercholesterolemia, unspecified: Secondary | ICD-10-CM | POA: Diagnosis not present

## 2019-08-26 MED ORDER — ONETOUCH ULTRA VI STRP: ORAL_STRIP | 11 refills | Status: DC

## 2019-08-26 MED FILL — ONE TOUCH ULTRA TEST STRIPS: 50 days supply | Qty: 100 | Fill #0

## 2019-08-26 NOTE — Progress Notes (Signed)
HPI: Courtney Crosby is a 73 y.o.-year-old female, returning for follow-up for DM2, dx in ~2015, non-insulin-dependent, now controlled, without long-term complications.  Last visit 4 months ago.  She had 2 recent gout episdes.  Reviewed HbA1c levels: Lab Results  Component Value Date   HGBA1C 6.4 (H) 08/23/2019   HGBA1C 6.4 (H) 02/26/2019   HGBA1C 6.1 (A) 01/02/2019   HGBA1C 7.8 (H) 08/20/2018   HGBA1C 7.5 (H) 03/06/2018   HGBA1C 8.0 (H) 01/02/2018   HGBA1C 7.9 (H) 11/10/2017   HGBA1C 7.7 (H) 09/26/2017   HGBA1C 8.3 (H) 08/07/2017   HGBA1C 7.8 (H) 01/31/2017   She is on - Metformin ER 1000 mg with dinner -started 09/2018 - diarrhea >> we had to reduce the dose >> 500 mg 2x a day - diarrhea 80% better, now tolerable We stopped Januvia 100 mg before b'fast 2/2 $$$-initially had soft stools with this, then they resolved She did not try Metformin >> mother and aunt had CKD while on this.  Pt checks her sugars once a day: - am: 109-127, 135 >> 113-134, 140, 154 >> 81, 106-130 - 2h after b'fast: n/c >> 114-134 >> n/c - before lunch: 104-137 >> 93-116, 136 >> 90-115, 131 - 2h after lunch: 133 >> 116-132 >> 130, 133 >> 119, 132 - before dinner: 99-140 >> 79-133, 162, 166 >> 99-122  - 2h after dinner:  99-147, 162 >> 109-133, 154 >> 80, 89, 131, 177 - bedtime: n/c >> 105-120 >> 97-127, 138 >> 108-127, 137 - nighttime: 130-140's >> 113 >> 129 Lowest sugar was 133 >> 99 >> 79 >> 80; she has hypoglycemia awareness in the 70s. Highest sugar was 215 >> 172 >> 166 >> 177.  Glucometer: One Touch.  Pt's meals are: - Breakfast: Eggo waffle and PB, cinnamon, honey - Lunch: Eats out - chick fil-a - Dinner: Eats out or spaghetti - Snacks: no usually, occas. At night - frozen grapes, chocolate candy, apples, berries She used to drink sodas but stopped.  -No CKD, last BUN/creatinine:  Lab Results  Component Value Date   BUN 11 08/23/2019   BUN 14 08/20/2018   CREATININE 0.69 08/23/2019    CREATININE 0.73 08/20/2018  On lisinopril.  -+ HL; last set of lipids: Lab Results  Component Value Date   CHOL 221 (H) 08/23/2019   CHOL 218 (H) 02/26/2019   CHOL 228 (H) 08/20/2018   Lab Results  Component Value Date   HDL 45 (L) 08/23/2019   HDL 41 (L) 02/26/2019   HDL 42 (L) 08/20/2018   Lab Results  Component Value Date   LDLCALC 141 (H) 08/23/2019   LDLCALC 144 (H) 02/26/2019   LDLCALC 158 (H) 08/20/2018   Lab Results  Component Value Date   TRIG 213 (H) 08/23/2019   TRIG 194 (H) 02/26/2019   TRIG 150 (H) 08/20/2018  She is intolerant to statins>> Myalgia and muscle weakness. Tried Crestor 3 times a week but could not tolerate it. She could not tolerate Zetia. She tried [employer] x1 mo in 02/2019 >> loose stools and stool incontinence >> stopped. Did not restart.  - last eye exam was in 02/2019: No DR  -no numbness and tingling in her feet.  Pt has FH of DM in mother, MGM, aunts and uncles, cousins.  She has a history of HTN distant history of melanoma -> 20 years ago, seasonal depression during winter, lichen sclerosis, osteoarthritis of thumbs.  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: no  weight gain/+ weight loss, no fatigue, no subjective hyperthermia, no subjective hypothermia Eyes: no blurry vision, no xerophthalmia ENT: no sore throat, no nodules palpated in neck, no dysphagia, no odynophagia, no hoarseness Cardiovascular: no CP/no SOB/no palpitations/no leg swelling Respiratory: no cough/no SOB/no wheezing Gastrointestinal: no N/no V/no D/no C/no acid reflux Musculoskeletal: no muscle aches/no joint aches Skin: no rashes, no hair loss Neurological: no tremors/no numbness/no tingling/no dizziness  I reviewed pt's medications, allergies, PMH, social hx, family hx, and changes were documented in the history of present illness. Otherwise, unchanged from my initial visit note.  Past Medical History:  Diagnosis Date  . Arthritis   . Blood  pressure elevated   . Cancer ([place])    melanoma  . Cholesterol serum elevated   . Diabetes mellitus   . H/O: cesarean section 1976 and 1978  . History of appendectomy age 40  . History of tonsillectomy 1965  . Hypercholesteremia 05/18/2011  . Hyperlipidemia   . Hypertension   . Melanoma ([place]) 05/18/2011   Past Surgical History:  Procedure Laterality Date  . APPENDECTOMY   1958  . [place]  . TONSILLECTOMY  1965   Social History   Socioeconomic History  . Marital status: Married    Spouse name: Not on file  . Number of children: 2  . Years of education: Not on file  . Highest education level: Not on file  Occupational History  .  Retired Marine scientist  Tobacco Use  . Smoking status: Never Smoker  . Smokeless tobacco: Never Used  Substance and Sexual Activity  . Alcohol use: No  . Drug use: No   Current Outpatient Medications on File Prior to Visit  Medication Sig Dispense Refill  . blood glucose meter kit and supplies KIT Dispense based on patient and insurance preference. Use up to four times daily as directed. (FOR ICD-9 250.00, 250.01). 1 each 0  . Clobetasol Prop Emollient Base (CLOBETASOL PROPIONATE E) 0.05 % emollient cream APPLY TO AFFECTED AREA EVERY DAY X 2-3 WEEKS,THEN EVERY OTHER DAY X2-3WEEKS,THEN TWICE PER WEEK 30 g 1  . lisinopril (PRINIVIL,ZESTRIL) 10 MG tablet TAKE 1 TABLET BY MOUTH DAILY. 90 tablet 3  . metFORMIN (GLUCOPHAGE-XR) 500 MG 24 hr tablet Take 2 tablets (1,000 mg total) by mouth daily with supper. 180 tablet 3  . naproxen sodium (ANAPROX) 220 MG tablet Take 220 mg by mouth 2 (two) times daily with a meal.    . ONETOUCH ULTRA test strip USE UP TO FOUR TIMES DAILY AS DIRECTED. 100 strip 0   No current facility-administered medications on file prior to visit.    Allergies  Allergen Reactions  . Codeine   . Epinephrine Palpitations    Takes hours for reaction of increase pulse and BP.   Family History  Problem Relation Age of Onset   . Diabetes Mother   . Heart disease Mother   . Hypertension Mother   . Hypertension Brother   . Diabetes Maternal Grandmother   . Heart disease Maternal Grandmother   . Hypertension Maternal Grandmother   . Hypertension Maternal Grandfather   . Hypertension Paternal Grandmother   . Hypertension Paternal Grandfather     PE: BP 138/90   Pulse 88   Ht '5\' 3"'  (1.6 m)   Wt 153 lb (69.4 kg)   SpO2 96%   BMI 27.10 kg/m  Wt Readings from Last 3 Encounters:  08/26/19 153 lb (69.4 kg)  04/24/19 158 lb (71.7 kg)  01/02/19  163 lb (73.9 kg)   Constitutional: Slightly overweight, in NAD Eyes: PERRLA, EOMI, no exophthalmos ENT: moist mucous membranes, no thyromegaly, no cervical lymphadenopathy Cardiovascular: RRR, No MRG Respiratory: CTA B Gastrointestinal: abdomen soft, NT, ND, BS+ Musculoskeletal: no deformities, strength intact in all 4 Skin: moist, warm, no rashes Neurological: no tremor with outstretched hands, DTR normal in all 4  ASSESSMENT: 1. DM2, non-insulin-dependent, uncontrolled, without long-term complications, but with hyperglycemia  2. HL  3.  Overweight  PLAN:  1. Patient with longstanding, uncontrolled diabetic regimen, with Metformin ER only.  We had to start Januvia at the previous visit, due to rise, however, her HbA1c was excellent, at 6.1% at that time and we continued only with Metformin.  At Last visit, HbA1c was higher, at 6.4%, but will at goal.  At last visit, sugars were mostly at goal with few hyperglycemic exceptions, after dietary indiscretions.  At last visit I advised him to split Metformin into 2 doses as she had loose stools and stool incontinence I advised her that if this continues, she can even reduce the dose to only 1 tab metformin with dinner. -We reviewed together her latest HbA1c from 3 days ago and this was stable, still at goal, at 6.4% - at this visit, sugars are well controlled, at all times of the day with only mild hyperglycemic  spikes, infrequent - since her diarrhea is much better >> will continue current regimen. I advised her to only take 1 tablet of Metformin if she has more diarrhea in a particular day - I suggested to:  Patient Instructions  Please continue: - Metformin ER 500 mg 2x a day with meals  Restart: - Red Yeast Rice 600 mg 2x a day  Please return in 4 months with your sugar log.   - advised to check sugars at different times of the day - 1x a day, rotating check times - advised for yearly eye exams >> she is UTD - return to clinic in 4 months  2. HL -Reviewed latest lipid panel from 3 days ago: All fractions abnormal Lab Results  Component Value Date   CHOL 221 (H) 08/23/2019   HDL 45 (L) 08/23/2019   LDLCALC 141 (H) 08/23/2019   TRIG 213 (H) 08/23/2019   CHOLHDL 4.9 08/23/2019  -She tried statins but could not tolerate them due to myalgias.  She also tried Crestor 3 times a week and Zetia and could not tolerate these.  She refused injectable medications for her hyperlipidemia.  She also refused to retry statins.  I suggested red yeast rice.  She had stool incontinence may be stopped after a month.  However, we discussed that this could have been due to Metformin and we adjusted the dose of Metformin at last visit.  I advised her that if her stool incontinence improved, to restart red yeast rice. She did not restart yet but diarrhea is 80% better >> will restart.  2.  Overweight -Lost 14 pounds in the last year, 5 from last visit -Continues to work on diet -We will continue Metformin low-dose, which has weight stabilizing effects long-term  Philemon Kingdom, MD PhD [facility] Endocrinology

## 2019-08-26 NOTE — Patient Instructions (Signed)
Please continue: - Metformin ER 500 mg 2x a day with meals  Also, continue: - Red Yeast Rice 600 mg 2x a day  Please return in 4 months with your sugar log.

## 2019-08-27 ENCOUNTER — Other Ambulatory Visit: Payer: Self-pay

## 2019-08-27 ENCOUNTER — Encounter: Payer: Self-pay | Admitting: Internal Medicine

## 2019-08-27 ENCOUNTER — Ambulatory Visit: Payer: Medicare Other | Admitting: Internal Medicine

## 2019-08-27 VITALS — BP 150/90 | HR 101 | Temp 98.0°F | Ht 62.25 in | Wt 153.0 lb

## 2019-08-27 DIAGNOSIS — E1169 Type 2 diabetes mellitus with other specified complication: Secondary | ICD-10-CM | POA: Diagnosis not present

## 2019-08-27 DIAGNOSIS — I10 Essential (primary) hypertension: Secondary | ICD-10-CM

## 2019-08-27 DIAGNOSIS — R748 Abnormal levels of other serum enzymes: Secondary | ICD-10-CM | POA: Diagnosis not present

## 2019-08-27 DIAGNOSIS — Z0000 Encounter for general adult medical examination without abnormal findings: Secondary | ICD-10-CM | POA: Diagnosis not present

## 2019-08-27 DIAGNOSIS — K580 Irritable bowel syndrome with diarrhea: Secondary | ICD-10-CM

## 2019-08-27 DIAGNOSIS — E785 Hyperlipidemia, unspecified: Secondary | ICD-10-CM

## 2019-08-27 NOTE — Progress Notes (Signed)
Subjective:    Patient ID: Courtney Crosby, female    DOB: 06/13/1946, 73 y.o.   MRN: [id number]  HPI 73 year old Female for health maintenance exam and evaluation of medical issues.Hx of Type 2 daibetes mellitus now under good control with Hgb AIC 6.4%. Feels slightly woozy when getting up at night. Takes Lisinopril 10 mg at bedtime. Can decrease to 5 mg hs and monitor BP at home. Metformin causes diarrhea. May try Metamucil as fiber supplement to see if it helps diarrhea. Fasting glucose 124. Saw Endocrinologist recently.K is 5.4 but think that is hemolysis and did not repeat. Total cholesterol 221, low HDL at 45, Triglycerides 213. Does not want to be on statin. Agrees to try red rice yeast.TSH is normal.  Hx HTN, obesity and metabolic syndrome.  Tetanus due but will defer as not covered by Medicare unless there is an injury. Pt declines flu vaccine.  Has been treated for precancerous lesions on face and arms by dermatologist.  Had no 1.  Herpes zoster left trunk 2006.  Fractured right humerus 2007.  History of appendectomy.  History of ingrown toenail surgery bilaterally.  C-sections 1976 in 1978.  History of GE reflux.  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  Husband is a retired Higher education careers adviser.  2 adult children, son and daughter.  Non-smoker.  Does not consume alcohol.  Has been careful about social distancing command pandemic.  Family history: Mother with history of diabetes and hypertension.   Review of Systems thinks she may have had an episode of gout involving thumb and also noted right tendon.  She took over-the-counter NSAIDs and improved.  Does not want uric acid checked today.    Objective:   Physical Exam Blood pressure elevated on arrival but recheck was 110/80.  Vital signs reviewed.  Skin warm and dry.  Nodes none.  TMs are clear.  Neck is supple without JVD thyromegaly or carotid bruits.  Chest is clear to  auscultation.  Cardiac exam regular rate and rhythm normal S1 and S2.  1/6 systolic ejection murmur.  Abdomen soft nondistended without hepatosplenomegaly masses or tenderness.  Bimanual without masses.  No lower extremity edema.  Neuro intact without focal deficits.  Thought, affect and mentation as well as judgment are within normal limits.       Assessment & Plan:  Mixed hyperlipidemia-does not want to be on statin medication  Low HDL cholesterol-level is 45  Controlled type 2 diabetes mellitus  History of hypertension but feeling woozy on 10 mg of lisinopril.  Decrease to 5 mg daily and monitor blood pressure.  Diarrhea on Metformin-May try Metamucil for bulking agent  Declines flu vaccine  Plan: Return in 6 months at which time she will have hemoglobin A1c and fasting lipid panel without liver functions as she is not on statin medication.  Try Metamucil for bulking agent to see if diarrhea improves.  Continue current medications.  Decrease lisinopril to 5 mg daily-- one half of a 10 mg tablet is okay for now and monitor blood pressure at home.  Subjective:   Patient presents for Medicare Annual/Subsequent preventive examination.  Review Past Medical/Family/Social: See above   Risk Factors  Current exercise habits: Active about her home Dietary issues discussed: Low-fat low carbohydrate  Cardiac risk factors: Hyperlipidemia and diabetes mellitus  Depression Screen  (Note: if answer to either of the following is "Yes", a more complete depression screening is indicated)  Over the past two weeks, have you felt down, depressed or hopeless? No  Over the past two weeks, have you felt little interest or pleasure in doing things? No Have you lost interest or pleasure in daily life? No Do you often feel hopeless? No Do you cry easily over simple problems? No   Activities of Daily Living  In your present state of health, do you have any difficulty performing the following  activities?:   Driving? No  Managing money? No  Feeding yourself? No  Getting from bed to chair? No  Climbing a flight of stairs? No  Preparing food and eating?: No  Bathing or showering? No  Getting dressed: No  Getting to the toilet? No  Using the toilet:No  Moving around from place to place: No  In the past year have you fallen or had a near fall?:No  Are you sexually active? No  Do you have more than one partner? No   Hearing Difficulties: No  Do you often ask people to speak up or repeat themselves?  Sometimes Do you experience ringing or noises in your ears? No  Do you have difficulty understanding soft or whispered voices?  Sometimes Do you feel that you have a problem with memory? No Do you often misplace items? No    Home Safety:  Do you have a smoke alarm at your residence? Yes Do you have grab bars in the bathroom?  Yes Do you have throw rugs in your house?  Yes   Cognitive Testing  Alert? Yes Normal Appearance?Yes  Oriented to person? Yes Place? Yes  Time? Yes  Recall of three objects? Yes  Can perform simple calculations? Yes  Displays appropriate judgment?Yes  Can read the correct time from a watch face?Yes   List the Names of Other Physician/Practitioners you currently use:  See referral list for the physicians patient is currently seeing.     Review of Systems: See above   Objective:     General appearance: Appears stated age Head: Normocephalic, without obvious abnormality, atraumatic  Eyes: conj clear, EOMi PEERLA  Ears: normal TM's and external ear canals both ears  Nose: Nares normal. Septum midline. Mucosa normal. No drainage or sinus tenderness.  Neck: no adenopathy, no carotid bruit, no JVD, supple, symmetrical, trachea midline and thyroid not enlarged, symmetric, no tenderness/mass/nodules  No CVA tenderness.  Lungs: clear to auscultation bilaterally  Breasts: normal appearance, no masses or tenderness Heart: regular rate and rhythm,  S1, S2 normal, 1/6 systolic ejection murmur Abdomen: soft, non-tender; bowel sounds normal; no masses, no organomegaly  Musculoskeletal: ROM normal in all joints, no crepitus, no deformity, Normal muscle strengthen. Back  is symmetric, no curvature. Skin: Skin color, texture, turgor normal. No rashes or lesions  Lymph nodes: Cervical, supraclavicular, and axillary nodes normal.  Neurologic: CN 2 -12 Normal, Normal symmetric reflexes. Normal coordination and gait  Psych: Alert & Oriented x 3, Mood appear stable.    Assessment:    Annual wellness medicare exam   Plan:    During the course of the visit the patient was educated and counseled about appropriate screening and preventive services including:   Flu vaccine declined  Recommend annual mammogram     Patient Instructions (the written plan) was given to the patient.  Medicare Attestation  I have personally reviewed:  The patient's medical and social history  Their use of alcohol, tobacco or illicit drugs  Their current medications and supplements  The patient's functional ability including ADLs,fall  risks, home safety risks, cognitive, and hearing and visual impairment  Diet and physical activities  Evidence for depression or mood disorders  The patient's weight, height, BMI, and visual acuity have been recorded in the chart. I have made referrals, counseling, and provided education to the patient based on review of the above and I have provided the patient with a written personalized care plan for preventive services.

## 2019-08-27 NOTE — Patient Instructions (Addendum)
It was a pleasure to see you today.  Change lisinopril to 5 mg daily (one half of a 10 mg tablet).  Follow-up in 6 months.  Monitor blood pressure at home.

## 2019-10-24 ENCOUNTER — Other Ambulatory Visit: Payer: Self-pay | Admitting: Internal Medicine

## 2019-10-24 MED FILL — ONE TOUCH ULTRA TEST STRIPS: 25 days supply | Qty: 100 | Fill #0

## 2019-10-24 MED FILL — ONETOUCH DELICA PLUS LANCET: 25 days supply | Qty: 100 | Fill #0

## 2019-10-24 MED FILL — METFORMIN HCL ER 500 MG TB2: 500 | 90 days supply | Qty: 180 | Fill #2

## 2019-12-19 ENCOUNTER — Other Ambulatory Visit: Payer: Self-pay

## 2019-12-23 ENCOUNTER — Ambulatory Visit: Payer: Medicare Other | Admitting: Internal Medicine

## 2019-12-23 ENCOUNTER — Other Ambulatory Visit: Payer: Self-pay

## 2019-12-23 ENCOUNTER — Encounter: Payer: Self-pay | Admitting: Internal Medicine

## 2019-12-23 VITALS — BP 138/80 | HR 88 | Ht 63.5 in | Wt 154.0 lb

## 2019-12-23 DIAGNOSIS — E7800 Pure hypercholesterolemia, unspecified: Secondary | ICD-10-CM | POA: Diagnosis not present

## 2019-12-23 DIAGNOSIS — E1165 Type 2 diabetes mellitus with hyperglycemia: Secondary | ICD-10-CM

## 2019-12-23 DIAGNOSIS — E663 Overweight: Secondary | ICD-10-CM

## 2019-12-23 LAB — POCT GLYCOSYLATED HEMOGLOBIN (HGB A1C): Hemoglobin A1C: 6.3 % — AB (ref 4.0–5.6)

## 2019-12-23 MED ORDER — METFORMIN HCL ER 500 MG PO TB24: 500.0000 mg | ORAL_TABLET | Freq: Every day | ORAL | 3 refills | Status: DC

## 2019-12-23 NOTE — Progress Notes (Signed)
This visit occurred during the SARS-CoV-2 public health emergency.  Safety protocols were in place, including screening questions prior to the visit, additional usage of staff PPE, and extensive cleaning of exam room while observing appropriate contact time as indicated for disinfecting solutions.   HPI: Courtney Crosby is a 74 y.o.-year-old female, returning for follow-up for DM2, dx in ~2015, non-insulin-dependent, now controlled, without long-term complications.  Last visit 4 months ago.  Reviewed HbA1c levels: Lab Results  Component Value Date   HGBA1C 6.4 (H) 08/23/2019   HGBA1C 6.4 (H) 02/26/2019   HGBA1C 6.1 (A) 01/02/2019   HGBA1C 7.8 (H) 08/20/2018   HGBA1C 7.5 (H) 03/06/2018   HGBA1C 8.0 (H) 01/02/2018   HGBA1C 7.9 (H) 11/10/2017   HGBA1C 7.7 (H) 09/26/2017   HGBA1C 8.3 (H) 08/07/2017   HGBA1C 7.8 (H) 01/31/2017   She is on - Metformin ER 1000 mg with dinner -started 09/2018 - diarrhea >> we had to reduce the dose >> 500 mg twice a day-diarrhea much better, but still soft stool We stopped Januvia 100 mg before b'fast 2/2 $$$-initially had soft stools with this, then they resolved She did not try Metformin >> mother and aunt had CKD while on this.  Pt checks her sugars once a day: - am: 113-134, 140, 154 >> 81, 106-130 >> 102-131 - 2h after b'fast: n/c >> 114-134 >> n/c >> 107-166 - before lunch: 93-116, 136 >> 90-115, 131 >> 92-129, 135 - 2h after lunch: 116-132 >> 130, 133 >> 119, 132 >> 133 - before dinner: 79-133, 162, 166 >> 99-122 >> 102-120, 132 - 2h after dinner:  80, 89, 131, 177 >> 130-195, 210 - bedtime: 97-127, 138 >> 108-127, 137 >> 106-135, 165 - nighttime: 130-140's >> 113 >> 129 Lowest sugar was 133 >> 99 >> 79 >> 80 >> 102; she has hypoglycemia awareness in the 70s. Highest sugar was 215 >> 172 >> 166 >> 177 >> 210.  Glucometer: One Touch.  Pt's meals are: - Breakfast: Eggo waffle and PB, cinnamon, honey - Lunch: Eats out - chick fil-a - Dinner:  Eats out or spaghetti - Snacks: no usually, occas. At night - frozen grapes, chocolate candy, apples, berries She used to drink sodas but stopped.  -No CKD, last BUN/creatinine:  Lab Results  Component Value Date   BUN 11 08/23/2019   BUN 14 08/20/2018   CREATININE 0.69 08/23/2019   CREATININE 0.73 08/20/2018  On lisinopril - dose decreased recently.  -+ HL; last set of lipids: Lab Results  Component Value Date   CHOL 221 (H) 08/23/2019   CHOL 218 (H) 02/26/2019   CHOL 228 (H) 08/20/2018   Lab Results  Component Value Date   HDL 45 (L) 08/23/2019   HDL 41 (L) 02/26/2019   HDL 42 (L) 08/20/2018   Lab Results  Component Value Date   LDLCALC 141 (H) 08/23/2019   LDLCALC 144 (H) 02/26/2019   LDLCALC 158 (H) 08/20/2018   Lab Results  Component Value Date   TRIG 213 (H) 08/23/2019   TRIG 194 (H) 02/26/2019   TRIG 150 (H) 08/20/2018  She is intolerant to statins >> myalgia and muscle weakness Tried Crestor 3 times a week but could not tolerate it. She could not tolerate Zetia. She tried [employer] x1 mo in 02/2019 >> loose stools and stool incontinence >> stopped. Did not restart.  However, this was likely due to Metformin.  I suggested again red yeast rice 08/2019. She still did not  start...  - last eye exam was in 02/2019: No DR  -She denies numbness and tingling in her feet.  Pt has FH of DM in mother, MGM, aunts and uncles, cousins.  She has a history of HTN, distant history of melanoma -> 20 years ago, seasonal depression during winter, lichen sclerosis, osteoarthritis of thumbs.  She also has a history of gout.  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: no weight gain/no weight loss, no fatigue, no subjective hyperthermia, no subjective hypothermia Eyes: no blurry vision, no xerophthalmia ENT: no sore throat, no nodules palpated in neck, no dysphagia, no odynophagia, no hoarseness Cardiovascular: no CP/no SOB/no palpitations/no leg swelling Respiratory:  no cough/no SOB/no wheezing Gastrointestinal: no N/no V/no D/no C/no acid reflux Musculoskeletal: no muscle aches/no joint aches Skin: no rashes, no hair loss Neurological: no tremors/no numbness/no tingling/no dizziness  I reviewed pt's medications, allergies, PMH, social hx, family hx, and changes were documented in the history of present illness. Otherwise, unchanged from my initial visit note.  Past Medical History:  Diagnosis Date  . Arthritis   . Blood pressure elevated   . Cancer ([place])    melanoma  . Cholesterol serum elevated   . Diabetes mellitus   . H/O: cesarean section 1976 and 1978  . History of appendectomy age 55  . History of tonsillectomy 1965  . Hypercholesteremia 05/18/2011  . Hyperlipidemia   . Hypertension   . Melanoma ([place]) 05/18/2011   Past Surgical History:  Procedure Laterality Date  . APPENDECTOMY   1958  . [place]  . TONSILLECTOMY  1965   Social History   Socioeconomic History  . Marital status: Married    Spouse name: Not on file  . Number of children: 2  . Years of education: Not on file  . Highest education level: Not on file  Occupational History  .  Retired Marine scientist  Tobacco Use  . Smoking status: Never Smoker  . Smokeless tobacco: Never Used  Substance and Sexual Activity  . Alcohol use: No  . Drug use: No   Current Outpatient Medications on File Prior to Visit  Medication Sig Dispense Refill  . blood glucose meter kit and supplies KIT Dispense based on patient and insurance preference. Use up to four times daily as directed. (FOR ICD-9 250.00, 250.01). 1 each 0  . Clobetasol Prop Emollient Base (CLOBETASOL PROPIONATE E) 0.05 % emollient cream APPLY TO AFFECTED AREA EVERY DAY X 2-3 WEEKS,THEN EVERY OTHER DAY X2-3WEEKS,THEN TWICE PER WEEK 30 g 1  . glucose blood (ONETOUCH ULTRA) test strip USE UP TO 4 TIMES DAILY AS DIRECTED. 100 strip prn  . lisinopril (PRINIVIL,ZESTRIL) 10 MG tablet TAKE 1 TABLET BY MOUTH DAILY.  (Patient taking differently: Take 5 mg by mouth daily. TAKE 1/2 A TABLET BY MOUTH DAILY) 90 tablet 3  . metFORMIN (GLUCOPHAGE-XR) 500 MG 24 hr tablet Take 2 tablets (1,000 mg total) by mouth daily with supper. 180 tablet 3  . naproxen sodium (ANAPROX) 220 MG tablet Take 220 mg by mouth 2 (two) times daily with a meal.    . OneTouch Delica Lancets 55M MISC USE UP TO FOUR TIMES DAILY AS DIRECTED 100 each 0   No current facility-administered medications on file prior to visit.   Allergies  Allergen Reactions  . Codeine   . Epinephrine Palpitations    Takes hours for reaction of increase pulse and BP.   Family History  Problem Relation Age of Onset  .  Diabetes Mother   . Heart disease Mother   . Hypertension Mother   . Hypertension Brother   . Diabetes Maternal Grandmother   . Heart disease Maternal Grandmother   . Hypertension Maternal Grandmother   . Hypertension Maternal Grandfather   . Hypertension Paternal Grandmother   . Hypertension Paternal Grandfather     PE: BP 138/80   Pulse 88   Ht 5' 3.5" (1.613 m) Comment: measured today without shoes  Wt 154 lb (69.9 kg)   SpO2 98%   BMI 26.85 kg/m  Wt Readings from Last 3 Encounters:  12/23/19 154 lb (69.9 kg)  08/27/19 153 lb (69.4 kg)  08/26/19 153 lb (69.4 kg)   Constitutional: overweight, in NAD Eyes: PERRLA, EOMI, no exophthalmos ENT: moist mucous membranes, no thyromegaly, no cervical lymphadenopathy Cardiovascular: RRR, No MRG Respiratory: CTA B Gastrointestinal: abdomen soft, NT, ND, BS+ Musculoskeletal: no deformities, strength intact in all 4 Skin: moist, warm, no rashes Neurological: no tremor with outstretched hands, DTR normal in all 4  ASSESSMENT: 1. DM2, non-insulin-dependent, now controlled, without long-term complications, but with hyperglycemia  2. HL  3.  Overweight  PLAN:  1. Patient with longstanding, uncontrolled diabetes type 2, oral medication regimen with Metformin ER only.  She was on  Januvia in the past but we were able to stop this.  She had loose stools with Metformin so we split this into doses and she is using the Metformin ER formulation. -At last visit, sugars were well controlled, with only mild, infrequent, hypoglycemic spikes.  HbA1c at that time was 6.4%, stable. -At this visit, sugars are mostly at goal with very few exceptions above goal, usually after dinner.  She had 1 blood sugar in the 200s after sweets, however, the vast majority of the sugars are at goal.  Since she is telling me that she still has occasional tenesmae and soft stools, we will decrease her Metformin dose even further, 41 - I suggested to:  Patient Instructions  Please decrease: - Metformin ER 500 mg only with dinner  Please return in 4 months with your sugar log.   - we checked her HbA1c: 6.3% (even better) - advised to check sugars at different times of the day - 1x a day, rotating check times - advised for yearly eye exams >> she is UTD - return to clinic in 4 months  2. HL -At this visit, we discussed about lipid targets -We reviewed latest lipid panel from 08/2019: LDL above target, as are her triglycerides.  HDL slightly low: Lab Results  Component Value Date   CHOL 221 (H) 08/23/2019   HDL 45 (L) 08/23/2019   LDLCALC 141 (H) 08/23/2019   TRIG 213 (H) 08/23/2019   CHOLHDL 4.9 08/23/2019  -She tried statins but could not tolerate them due to myalgias.  She tried Crestor 3 times a week and Zetia and could not tolerate these, either.  She refused injectable medications for her hyperlipidemia.  States she refused to retry statins at last visit I again suggested red yeast rice, but she again did not start...  2.  Overweight -She lost 14 pounds in the year before last visit, now stable -Continues to work on her diet -We will also continue Metformin low-dose, which is already stabilizing effect long-term, but we will decrease the dose due to GI symptoms  Philemon Kingdom, MD  PhD [facility]

## 2019-12-23 NOTE — Patient Instructions (Addendum)
Please decrease: - Metformin ER 500 mg only with dinner  Please return in 4 months with your sugar log.

## 2019-12-23 NOTE — Addendum Note (Signed)
Addended by: Cardell Peach I on: 12/23/2019 10:14 AM   Modules accepted: Orders

## 2020-01-06 MED FILL — ONE TOUCH ULTRA TEST STRIPS: 25 days supply | Qty: 100 | Fill #1

## 2020-02-13 ENCOUNTER — Encounter: Payer: Self-pay | Admitting: Internal Medicine

## 2020-02-13 LAB — HM DIABETES EYE EXAM

## 2020-02-20 ENCOUNTER — Other Ambulatory Visit: Payer: Self-pay

## 2020-02-20 ENCOUNTER — Other Ambulatory Visit: Payer: Medicare Other | Admitting: Internal Medicine

## 2020-02-20 DIAGNOSIS — E119 Type 2 diabetes mellitus without complications: Secondary | ICD-10-CM

## 2020-02-20 DIAGNOSIS — E7800 Pure hypercholesterolemia, unspecified: Secondary | ICD-10-CM

## 2020-02-21 LAB — HEMOGLOBIN A1C
Hgb A1c MFr Bld: 6.4 % of total Hgb — ABNORMAL HIGH (ref <5.7)
Mean Plasma Glucose: 137 (calc)
eAG (mmol/L): 7.6 (calc)

## 2020-02-21 LAB — LIPID PANEL
Cholesterol: 221 mg/dL — ABNORMAL HIGH (ref <200)
HDL: 47 mg/dL — ABNORMAL LOW (ref >=50)
LDL Cholesterol (Calc): 134 mg/dL (calc) — ABNORMAL HIGH
Non-HDL Cholesterol (Calc): 174 mg/dL (calc) — ABNORMAL HIGH (ref <130)
Total CHOL/HDL Ratio: 4.7 (calc) (ref <5.0)
Triglycerides: 245 mg/dL — ABNORMAL HIGH (ref <150)

## 2020-02-24 ENCOUNTER — Ambulatory Visit: Payer: Medicare Other | Admitting: Internal Medicine

## 2020-02-24 ENCOUNTER — Other Ambulatory Visit: Payer: Self-pay

## 2020-02-24 ENCOUNTER — Encounter: Payer: Self-pay | Admitting: Internal Medicine

## 2020-02-24 VITALS — BP 120/90 | HR 70 | Temp 98.3°F | Ht 63.5 in | Wt 153.0 lb

## 2020-02-24 DIAGNOSIS — R011 Cardiac murmur, unspecified: Secondary | ICD-10-CM

## 2020-02-24 NOTE — Progress Notes (Signed)
   Subjective:    Patient ID: TAYLRE RAISON, female    DOB: 02-28-1946, 74 y.o.   MRN: [id number]  HPI 74 year old Female for 6 month recheck. Husband has developed short term memory loss.  This is been quite stressful for patient.  She has appointment to see endocrinologist in July.  Recent labs show total cholesterol 221 unchanged from November 2020.  Triglycerides have increased 213 to 245. HDL remains low at 47.  LDL is 134.  I cannot get her to consider statin medication even at low-dose.  Hemoglobin A1c stable at 6.4% and previously was 6.3% in March.   Review of Systems see above-obviously situational stress with husband    Objective:   Physical Exam Blood pressure 120/90 pulse 70 BMI 26.68 weight 153 pounds  Neck is supple without JVD thyromegaly or carotid bruits.  Chest clear to auscultation.  Cardiac exam regular rate and rhythm.  She has a 2/6 to 3/6 systolic ejection murmur which I do not recall hearing previously.  No lower extremity edema.  Spent additional time speaking with her about concerns about her husband.       Assessment & Plan:  Situational stress with husband  Essential hypertension-stable  Hyperlipidemia-refuses statin therapy  Type 2 diabetes mellitus.  Hemoglobin A1c stable at [id number]  Systolic ejection murmur- to have 2D echocardiogram in early June and will be following up with endocrinologist in the near future.  Plan: Await echocardiogram results.  CPE will be due in 6 months.

## 2020-03-02 ENCOUNTER — Other Ambulatory Visit: Payer: Self-pay | Admitting: Internal Medicine

## 2020-03-02 MED FILL — METFORMIN HCL ER 500 MG TB2: 500 | 90 days supply | Qty: 180 | Fill #0

## 2020-03-03 ENCOUNTER — Telehealth: Payer: Self-pay | Admitting: Internal Medicine

## 2020-03-03 NOTE — Telephone Encounter (Signed)
Left message on voicemail to call office. Need to see what dosage Metformin pt is taking?

## 2020-03-03 NOTE — Telephone Encounter (Signed)
Called pharmacy and spoke to [place] regarding pt's Metformin Rx told her pt was decreased to Metformin XR 500 mg at supper at last visit. Junie Panning said pt picked up Rx yesterday for Metformin XR 500 mg 2 tablets with supper. She said you may want to call pt and see what she is taking. Told her I will.

## 2020-03-03 NOTE — Telephone Encounter (Signed)
Pt called back and stated she is taking Metformin XR 500 mg only at supper.

## 2020-03-03 NOTE — Telephone Encounter (Signed)
Patient called after speaking to [place] to request that the change of dosage for Metformin be called into [facility] (verication that dosage was reduced)

## 2020-03-08 NOTE — Patient Instructions (Signed)
Echocardiogram has been ordered for evaluation of systolic murmur.  Continue current medications.  Watch diet.  Try to get some exercise.  Follow-up in 6 months.  See endocrinologist soon.

## 2020-03-12 ENCOUNTER — Ambulatory Visit (HOSPITAL_COMMUNITY): Payer: Medicare Other | Attending: Cardiovascular Disease

## 2020-03-12 ENCOUNTER — Other Ambulatory Visit: Payer: Self-pay

## 2020-03-12 DIAGNOSIS — R011 Cardiac murmur, unspecified: Secondary | ICD-10-CM | POA: Insufficient documentation

## 2020-03-16 ENCOUNTER — Other Ambulatory Visit: Payer: Self-pay

## 2020-03-16 ENCOUNTER — Encounter: Payer: Self-pay | Admitting: Internal Medicine

## 2020-03-16 ENCOUNTER — Ambulatory Visit: Payer: Medicare Other | Admitting: Internal Medicine

## 2020-03-16 VITALS — BP 130/80 | HR 67 | Ht 63.5 in | Wt 154.0 lb

## 2020-03-16 DIAGNOSIS — I350 Nonrheumatic aortic (valve) stenosis: Secondary | ICD-10-CM | POA: Diagnosis not present

## 2020-03-16 DIAGNOSIS — E1169 Type 2 diabetes mellitus with other specified complication: Secondary | ICD-10-CM

## 2020-03-16 DIAGNOSIS — E785 Hyperlipidemia, unspecified: Secondary | ICD-10-CM

## 2020-03-16 NOTE — Progress Notes (Signed)
   Subjective:    Patient ID: Courtney Crosby, female    DOB: 11-05-1945, 74 y.o.   MRN: [id number]  HPI 74 year old Female for discussion of recent echocardiogram.  She was here for 106-month recheck in mid May and I heard a murmur that was a 7-6/8 systolic ejection murmur that I do not recall hearing previously.  She had no symptoms of heart failure.  She was sent for 2D echocardiogram.  Left ventricular ejection fraction 60 to 65%.  Left ventricle had normal function.  No regional wall motion abnormalities.  The mitral valve is degenerative with trivial mitral valve regurgitation and no evidence of mitral stenosis.  Normal pulmonary artery pressure.  No aortic valve regurgitation.  Mild aortic valve stenosis.  Grade 1 diastolic dysfunction.  Moderate calcification of the aortic valve and aortic valve regurgitation is not visualized.  Mild aortic stenosis is present.  Aortic valve peak gradient measures 13.3 mmHg.  Suggest close follow-up.  This was discussed with her today.  She is on lisinopril and Metformin.  Needs to lose weight and monitor her symptoms.   Review of Systems     Objective:   Physical Exam        Assessment & Plan:

## 2020-04-08 ENCOUNTER — Other Ambulatory Visit: Payer: Self-pay | Admitting: Internal Medicine

## 2020-04-08 MED FILL — ONE TOUCH ULTRA TEST STRIPS: 25 days supply | Qty: 100 | Fill #2

## 2020-04-08 NOTE — Patient Instructions (Signed)
Patient has mild aortic stenosis and will continue to be followed closely.  She is seeing her every 6 months and is asymptomatic.

## 2020-04-21 ENCOUNTER — Other Ambulatory Visit: Payer: Self-pay | Admitting: Internal Medicine

## 2020-04-21 DIAGNOSIS — Z1231 Encounter for screening mammogram for malignant neoplasm of breast: Secondary | ICD-10-CM

## 2020-04-24 ENCOUNTER — Ambulatory Visit: Payer: Medicare Other | Admitting: Internal Medicine

## 2020-04-24 ENCOUNTER — Encounter: Payer: Self-pay | Admitting: Internal Medicine

## 2020-04-24 ENCOUNTER — Other Ambulatory Visit: Payer: Self-pay

## 2020-04-24 VITALS — BP 150/90 | HR 80 | Ht 63.5 in | Wt 155.0 lb

## 2020-04-24 DIAGNOSIS — G720 Drug-induced myopathy: Secondary | ICD-10-CM | POA: Diagnosis not present

## 2020-04-24 DIAGNOSIS — E1165 Type 2 diabetes mellitus with hyperglycemia: Secondary | ICD-10-CM | POA: Diagnosis not present

## 2020-04-24 DIAGNOSIS — E663 Overweight: Secondary | ICD-10-CM

## 2020-04-24 DIAGNOSIS — E7800 Pure hypercholesterolemia, unspecified: Secondary | ICD-10-CM

## 2020-04-24 DIAGNOSIS — T466X5A Adverse effect of antihyperlipidemic and antiarteriosclerotic drugs, initial encounter: Secondary | ICD-10-CM

## 2020-04-24 LAB — POCT GLYCOSYLATED HEMOGLOBIN (HGB A1C): Hemoglobin A1C: 6.5 % — AB (ref 4.0–5.6)

## 2020-04-24 NOTE — Addendum Note (Signed)
Addended by: Cardell Peach I on: 04/24/2020 09:31 AM   Modules accepted: Orders

## 2020-04-24 NOTE — Patient Instructions (Signed)
Please continue: - Metformin ER 500 mg 2x a day for few more days (1 week), then decrease to 1x a day  Please return in 4 months with your sugar log.

## 2020-04-24 NOTE — Progress Notes (Signed)
This visit occurred during the SARS-CoV-2 public health emergency.  Safety protocols were in place, including screening questions prior to the visit, additional usage of staff PPE, and extensive cleaning of exam room while observing appropriate contact time as indicated for disinfecting solutions.   HPI: Courtney Crosby is a 74 y.o.-year-old female, returning for follow-up for DM2, dx in ~2015, non-insulin-dependent, now controlled, without long-term complications.  Last visit 4 months ago.  Reviewed HbA1c levels: Lab Results  Component Value Date   HGBA1C 6.4 (H) 02/20/2020   HGBA1C 6.3 (A) 12/23/2019   HGBA1C 6.4 (H) 08/23/2019   HGBA1C 6.4 (H) 02/26/2019   HGBA1C 6.1 (A) 01/02/2019   HGBA1C 7.8 (H) 08/20/2018   HGBA1C 7.5 (H) 03/06/2018   HGBA1C 8.0 (H) 01/02/2018   HGBA1C 7.9 (H) 11/10/2017   HGBA1C 7.7 (H) 09/26/2017   She is on - Metformin ER 1000 mg with dinner -started 09/2018 - diarrhea >> we had to reduce the dose >> 500 mg once a day >> twice a day in last 2 weeks We stopped Januvia 100 mg before b'fast 2/2 $$$-initially had soft stools with this, then they resolved She did not try Metformin >> mother and aunt had CKD while on this.  Pt checks her sugars once a day: - am: 113-134, 140, 154 >> 81, 106-130 >> 102-131 >> 76-147 - 2h after b'fast: n/c >> 114-134 >> n/c >> 107-166 >> 69, 135-159 - before lunch: 93-116, 136 >> 90-115, 131 >> 92-129, 135 >> 106-160 - 2h after lunch: 116-132 >> 130, 133 >> 119, 132 >> 133 >> 161-183 - before dinner: 79-133, 162, 166 >> 99-122 >> 102-120, 132 >> 116-127  - 2h after dinner:  80, 89, 131, 177 >> 130-195, 210 >> 123-159, 179 - bedtime: 97-127, 138 >> 108-127, 137 >> 106-135, 165 >> 107-160, 185 - nighttime: 130-140's >> 113 >> 129 Lowest sugar was 102 >> 69 x1; she has hypoglycemia awareness in the 70s. Highest sugar was: 210 >> 185.  Glucometer: One Touch.  Pt's meals are: - Breakfast: Eggo waffle and PB, cinnamon, honey -  Lunch: Eats out - chick fil-a - Dinner: Eats out or spaghetti - Snacks: no usually, occas. At night - frozen grapes, chocolate candy, apples, berries She used to drink sodas but stopped.  -No CKD, last BUN/creatinine:  Lab Results  Component Value Date   BUN 11 08/23/2019   BUN 14 08/20/2018   CREATININE 0.69 08/23/2019   CREATININE 0.73 08/20/2018  On lisinopril.  -+ HL; last set of lipids: Lab Results  Component Value Date   CHOL 221 (H) 02/20/2020   CHOL 221 (H) 08/23/2019   CHOL 218 (H) 02/26/2019   Lab Results  Component Value Date   HDL 47 (L) 02/20/2020   HDL 45 (L) 08/23/2019   HDL 41 (L) 02/26/2019   Lab Results  Component Value Date   LDLCALC 134 (H) 02/20/2020   LDLCALC 141 (H) 08/23/2019   LDLCALC 144 (H) 02/26/2019   Lab Results  Component Value Date   TRIG 245 (H) 02/20/2020   TRIG 213 (H) 08/23/2019   TRIG 194 (H) 02/26/2019  She is intolerant to statins due to myalgias and muscle weakness. Tried Crestor 3 times a week but could not tolerate it. She could not tolerate Zetia. She tried [employer] x1 mo in 02/2019 >> loose stools and stool incontinence (retrospectively, most likely caused by Metformin)>> stopped. We restarted this at 600 mg once a day at last visit.  Now tolerated well.  - last eye exam was in 02/2020: No DR  - no numbness and tingling in her feet.  Pt has FH of DM in mother, MGM, aunts and uncles, cousins.  She has a history of HTN, distant history of melanoma -> 20 years ago, seasonal depression during winter, lichen sclerosis, osteoarthritis of thumbs.  She also has a history of gout.  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: + weight gain/no weight loss, no fatigue, no subjective hyperthermia, no subjective hypothermia Eyes: no blurry vision, no xerophthalmia ENT: no sore throat, no nodules palpated in neck, no dysphagia, no odynophagia, no hoarseness Cardiovascular: no CP/no SOB/no palpitations/no leg  swelling Respiratory: no cough/no SOB/no wheezing Gastrointestinal: no N/no V/no D/no C/no acid reflux Musculoskeletal: no muscle aches/no joint aches Skin: no rashes, no hair loss Neurological: no tremors/no numbness/no tingling/no dizziness  I reviewed pt's medications, allergies, PMH, social hx, family hx, and changes were documented in the history of present illness. Otherwise, unchanged from my initial visit note.  Past Medical History:  Diagnosis Date  . Arthritis   . Blood pressure elevated   . Cancer ([place])    melanoma  . Cholesterol serum elevated   . Diabetes mellitus   . H/O: cesarean section 1976 and 1978  . History of appendectomy age 1  . History of tonsillectomy 1965  . Hypercholesteremia 05/18/2011  . Hyperlipidemia   . Hypertension   . Melanoma ([place]) 05/18/2011   Past Surgical History:  Procedure Laterality Date  . APPENDECTOMY   1958  . [place]  . TONSILLECTOMY  1965   Social History   Socioeconomic History  . Marital status: Married    Spouse name: Not on file  . Number of children: 2  . Years of education: Not on file  . Highest education level: Not on file  Occupational History  .  Retired Marine scientist  Tobacco Use  . Smoking status: Never Smoker  . Smokeless tobacco: Never Used  Substance and Sexual Activity  . Alcohol use: No  . Drug use: No   Current Outpatient Medications on File Prior to Visit  Medication Sig Dispense Refill  . blood glucose meter kit and supplies KIT Dispense based on patient and insurance preference. Use up to four times daily as directed. (FOR ICD-9 250.00, 250.01). 1 each 0  . Clobetasol Prop Emollient Base 0.05 % emollient cream APPLY TO AFFECTED AREA EVERY DAY X 2-3 WEEKS,THEN EVERY OTHER DAY X2-3WEEKS,THEN TWICE PER WEEK 60 g 1  . glucose blood (ONETOUCH ULTRA) test strip USE UP TO 4 TIMES DAILY AS DIRECTED. 100 strip prn  . lisinopril (PRINIVIL,ZESTRIL) 10 MG tablet TAKE 1 TABLET BY MOUTH DAILY.  (Patient taking differently: Take 5 mg by mouth daily. TAKE 1/2 A TABLET BY MOUTH DAILY) 90 tablet 3  . metFORMIN (GLUCOPHAGE-XR) 500 MG 24 hr tablet TAKE 2 TABLETS (1,000 MG TOTAL) BY MOUTH DAILY WITH SUPPER. 180 tablet 1  . naproxen sodium (ANAPROX) 220 MG tablet Take 220 mg by mouth 2 (two) times daily with a meal.    . OneTouch Delica Lancets 67T MISC USE UP TO FOUR TIMES DAILY AS DIRECTED 100 each 0   No current facility-administered medications on file prior to visit.   Allergies  Allergen Reactions  . Codeine   . Epinephrine Palpitations    Takes hours for reaction of increase pulse and BP.   Family History  Problem Relation Age of Onset  . Diabetes  Mother   . Heart disease Mother   . Hypertension Mother   . Hypertension Brother   . Diabetes Maternal Grandmother   . Heart disease Maternal Grandmother   . Hypertension Maternal Grandmother   . Hypertension Maternal Grandfather   . Hypertension Paternal Grandmother   . Hypertension Paternal Grandfather     PE: BP (!) 150/90   Pulse 80   Ht 5' 3.5" (1.613 m)   Wt 155 lb (70.3 kg)   SpO2 97%   BMI 27.03 kg/m  Wt Readings from Last 3 Encounters:  04/24/20 155 lb (70.3 kg)  03/16/20 154 lb (69.9 kg)  02/24/20 153 lb (69.4 kg)   Constitutional: overweight, in NAD Eyes: PERRLA, EOMI, no exophthalmos ENT: moist mucous membranes, no thyromegaly, no cervical lymphadenopathy Cardiovascular: RRR, No MRG Respiratory: CTA B Gastrointestinal: abdomen soft, NT, ND, BS+ Musculoskeletal: no deformities, strength intact in all 4 Skin: moist, warm, no rashes Neurological: no tremor with outstretched hands, DTR normal in all 4  ASSESSMENT: 1. DM2, non-insulin-dependent, now controlled, without long-term complications, but with hyperglycemia  2. HL  3.  Overweight  4.  Statin induced myopathy  PLAN:  1. Patient with longstanding, uncontrolled, type 2 diabetes, on oral medication with low-dose Metformin ER only.  At last  visit we had to decrease the dose of Metformin due to loose stools.  She was on Januvia in the past but we stopped it as her sugars were controlled.  At last visit, sugars were mostly at goal, with few exceptions in the hyperglycemic range, usually after dinner.  She only had one blood sugar in the 200s after eating sweets, however, the majority of her blood sugars were at goal.  An HbA1c level was improved, at 6.3%.  However, 2 months ago she had another HbA1c which was slightly higher at 6.4%. -At this visit, sugars appear to be well controlled up until middle of last week, when she became dehydrated and he started to increase.  Looking back, she also had an episode of gastroenteritis at that time.  She increase the dose of Metformin from 1 tablet to 2 tablets daily at that time and sugars started to improve.  Her gastroenteritis has resolved and we discussed to continue with the double dose of Metformin for now but to reduce it back to once a day after approximately a week - I suggested to:  Patient Instructions  Please continue: - Metformin ER 500 mg 2x a day for few more days (1 week), then decrease to 1x a day  Please return in 4 months with your sugar log.   - we checked her HbA1c: 6.5% (slightly higher, but still well within target) - advised to check sugars at different times of the day - 1x a day, rotating check times - advised for yearly eye exams >> she is UTD - return to clinic in 4 months  2. HL - Reviewed latest lipid panel from 02/2020: LDL above target, triglycerides high, HDL slightly low: Lab Results  Component Value Date   CHOL 221 (H) 02/20/2020   HDL 47 (L) 02/20/2020   LDLCALC 134 (H) 02/20/2020   TRIG 245 (H) 02/20/2020   CHOLHDL 4.7 02/20/2020  -She could not tolerate statins due to myalgias.  She tried Crestor 3 times a week and Zetia and could not tolerate these either.  She refused injectable medications for hyperlipidemia.  I also suggested red yeast rice in the  past and she restarted once a day >> will increase  to 2x a day  3.  Overweight -She continues to work on her diet - gained 1 lb since last OV -We are continuing low-dose Metformin, which has a weight stabilizing effect long-term  4.  Statin induced myopathy -Refused statins due to previous myalgias, but now does well on the red yeast rice once a day-we increased the dose today to twice a day - see pb #2  Philemon Kingdom, MD PhD [facility]

## 2020-04-24 NOTE — Progress Notes (Signed)
Thanks so much. I appreciate your input and assistance.

## 2020-05-19 ENCOUNTER — Other Ambulatory Visit: Payer: Self-pay

## 2020-05-19 ENCOUNTER — Ambulatory Visit
Admission: RE | Admit: 2020-05-19 | Discharge: 2020-05-19 | Disposition: A | Discharge status: Home / Self Care | Payer: Medicare Other | Source: Ambulatory Visit | Attending: Internal Medicine | Admitting: Internal Medicine

## 2020-05-19 DIAGNOSIS — Z1231 Encounter for screening mammogram for malignant neoplasm of breast: Secondary | ICD-10-CM

## 2020-06-03 MED FILL — ONE TOUCH ULTRA TEST STRIPS: 25 days supply | Qty: 100 | Fill #3

## 2020-07-15 MED FILL — METFORMIN HCL ER 500 MG TB2: 500 | 90 days supply | Qty: 180 | Fill #1

## 2020-08-17 ENCOUNTER — Other Ambulatory Visit: Payer: Self-pay | Admitting: Internal Medicine

## 2020-08-17 ENCOUNTER — Other Ambulatory Visit (HOSPITAL_COMMUNITY): Payer: Self-pay | Admitting: Internal Medicine

## 2020-08-17 MED FILL — ONE TOUCH ULTRA TEST STRIPS: 25 days supply | Qty: 100 | Fill #4

## 2020-08-17 MED FILL — ONETOUCH DELICA PLUS LANCET: 25 days supply | Qty: 100 | Fill #0

## 2020-08-24 ENCOUNTER — Other Ambulatory Visit: Payer: Medicare Other | Admitting: Internal Medicine

## 2020-08-24 ENCOUNTER — Other Ambulatory Visit: Payer: Self-pay

## 2020-08-24 DIAGNOSIS — E119 Type 2 diabetes mellitus without complications: Secondary | ICD-10-CM

## 2020-08-24 DIAGNOSIS — I10 Essential (primary) hypertension: Secondary | ICD-10-CM

## 2020-08-24 DIAGNOSIS — R011 Cardiac murmur, unspecified: Secondary | ICD-10-CM

## 2020-08-24 DIAGNOSIS — R748 Abnormal levels of other serum enzymes: Secondary | ICD-10-CM

## 2020-08-24 DIAGNOSIS — I350 Nonrheumatic aortic (valve) stenosis: Secondary | ICD-10-CM

## 2020-08-24 DIAGNOSIS — Z0000 Encounter for general adult medical examination without abnormal findings: Secondary | ICD-10-CM

## 2020-08-24 DIAGNOSIS — E782 Mixed hyperlipidemia: Secondary | ICD-10-CM

## 2020-08-24 DIAGNOSIS — K580 Irritable bowel syndrome with diarrhea: Secondary | ICD-10-CM

## 2020-08-24 DIAGNOSIS — Z6830 Body mass index (BMI) 30.0-30.9, adult: Secondary | ICD-10-CM

## 2020-08-24 DIAGNOSIS — E785 Hyperlipidemia, unspecified: Secondary | ICD-10-CM

## 2020-08-25 ENCOUNTER — Ambulatory Visit: Payer: Medicare Other | Admitting: Internal Medicine

## 2020-08-25 ENCOUNTER — Encounter: Payer: Self-pay | Admitting: Internal Medicine

## 2020-08-25 VITALS — BP 130/88 | HR 85 | Ht 63.0 in | Wt 156.0 lb

## 2020-08-25 DIAGNOSIS — E663 Overweight: Secondary | ICD-10-CM | POA: Diagnosis not present

## 2020-08-25 DIAGNOSIS — E7800 Pure hypercholesterolemia, unspecified: Secondary | ICD-10-CM | POA: Diagnosis not present

## 2020-08-25 DIAGNOSIS — E1165 Type 2 diabetes mellitus with hyperglycemia: Secondary | ICD-10-CM

## 2020-08-25 LAB — CBC WITH DIFFERENTIAL/PLATELET
Absolute Monocytes: 627 cells/uL (ref 200–950)
Basophils Absolute: 51 cells/uL (ref 0–200)
Basophils Relative: 0.8 %
Eosinophils Absolute: 141 cells/uL (ref 15–500)
Eosinophils Relative: 2.2 %
HCT: 47.5 % — ABNORMAL HIGH (ref 35.0–45.0)
Hemoglobin: 16.3 g/dL — ABNORMAL HIGH (ref 11.7–15.5)
Lymphs Abs: 1997 cells/uL (ref 850–3900)
MCH: 30.1 pg (ref 27.0–33.0)
MCHC: 34.3 g/dL (ref 32.0–36.0)
MCV: 87.8 fL (ref 80.0–100.0)
MPV: 12.2 fL (ref 7.5–12.5)
Monocytes Relative: 9.8 %
Neutro Abs: 3584 cells/uL (ref 1500–7800)
Neutrophils Relative %: 56 %
Platelets: 254 10*3/uL (ref 140–400)
RBC: 5.41 10*6/uL — ABNORMAL HIGH (ref 3.80–5.10)
RDW: 12.6 % (ref 11.0–15.0)
Total Lymphocyte: 31.2 %
WBC: 6.4 10*3/uL (ref 3.8–10.8)

## 2020-08-25 LAB — COMPLETE METABOLIC PANEL WITH GFR
AG Ratio: 1.4 (calc) (ref 1.0–2.5)
ALT: 15 U/L (ref 6–29)
AST: 16 U/L (ref 10–35)
Albumin: 4.2 g/dL (ref 3.6–5.1)
Alkaline phosphatase (APISO): 52 U/L (ref 37–153)
BUN: 12 mg/dL (ref 7–25)
CO2: 27 mmol/L (ref 20–32)
Calcium: 9.9 mg/dL (ref 8.6–10.4)
Chloride: 101 mmol/L (ref 98–110)
Creat: 0.73 mg/dL (ref 0.60–0.93)
GFR, Est African American: 93 mL/min/{1.73_m2} (ref >=60)
GFR, Est Non African American: 80 mL/min/{1.73_m2} (ref >=60)
Globulin: 2.9 g/dL (calc) (ref 1.9–3.7)
Glucose, Bld: 148 mg/dL — ABNORMAL HIGH (ref 65–99)
Potassium: 4.5 mmol/L (ref 3.5–5.3)
Sodium: 138 mmol/L (ref 135–146)
Total Bilirubin: 0.5 mg/dL (ref 0.2–1.2)
Total Protein: 7.1 g/dL (ref 6.1–8.1)

## 2020-08-25 LAB — LIPID PANEL
Cholesterol: 228 mg/dL — ABNORMAL HIGH (ref <200)
HDL: 47 mg/dL — ABNORMAL LOW (ref >=50)
LDL Cholesterol (Calc): 145 mg/dL (calc) — ABNORMAL HIGH
Non-HDL Cholesterol (Calc): 181 mg/dL (calc) — ABNORMAL HIGH (ref <130)
Total CHOL/HDL Ratio: 4.9 (calc) (ref <5.0)
Triglycerides: 215 mg/dL — ABNORMAL HIGH (ref <150)

## 2020-08-25 LAB — MICROALBUMIN / CREATININE URINE RATIO
Creatinine, Urine: 27 mg/dL (ref 20–275)
Microalb Creat Ratio: 15 mcg/mg creat (ref <30)
Microalb, Ur: 0.4 mg/dL

## 2020-08-25 LAB — TSH: TSH: 1.47 mIU/L (ref 0.40–4.50)

## 2020-08-25 LAB — HEMOGLOBIN A1C
Hgb A1c MFr Bld: 6.8 % of total Hgb — ABNORMAL HIGH (ref <5.7)
Mean Plasma Glucose: 148 (calc)
eAG (mmol/L): 8.2 (calc)

## 2020-08-25 NOTE — Patient Instructions (Addendum)
Please continue: - Metformin ER 500 mg 1-2x a day   Please return in 4 months with your sugar log.

## 2020-08-25 NOTE — Progress Notes (Signed)
This visit occurred during the SARS-CoV-2 public health emergency.  Safety protocols were in place, including screening questions prior to the visit, additional usage of staff PPE, and extensive cleaning of exam room while observing appropriate contact time as indicated for disinfecting solutions.   HPI: Courtney Crosby is a 74 y.o.-year-old female, returning for follow-up for DM2, dx in ~2015, non-insulin-dependent, now controlled, without long-term complications.  Last visit 4 months ago.  Reviewed HbA1c levels: Lab Results  Component Value Date   HGBA1C 6.8 (H) 08/24/2020   HGBA1C 6.5 (A) 04/24/2020   HGBA1C 6.4 (H) 02/20/2020   HGBA1C 6.3 (A) 12/23/2019   HGBA1C 6.4 (H) 08/23/2019   HGBA1C 6.4 (H) 02/26/2019   HGBA1C 6.1 (A) 01/02/2019   HGBA1C 7.8 (H) 08/20/2018   HGBA1C 7.5 (H) 03/06/2018   HGBA1C 8.0 (H) 01/02/2018   She is on - Metformin ER 1000 mg with dinner -started 09/2018 - diarrhea >> we had to reduce the dose >> 500 mg once a day >> 500 mg 2x a day -has some diarrhea with this dose We stopped Januvia 100 mg before b'fast 2/2 $$$-initially had soft stools with this, then they resolved She did not try Metformin >> mother and aunt had CKD while on this.  Pt checks her sugars once a day: - am: 81, 106-130 >> 102-131 >> 76-147 >> 92, 110-141 - 2h after b'fast: 107-166 >> 69, 135-159 >> 94-144 - before lunch: 92-129, 135 >> 106-160 >> 64, 74-123 - 2h after lunch: 119, 132 >> 133 >> 161-183 >> 100 - before dinner:  102-120, 132 >> 116-127 >> 74, 94-142 - 2h after dinner: 130-195, 210 >> 123-159, 179 >> 137, 152 - bedtime: 106-135, 165 >> 107-160, 185 >> 102-144, 165 - nighttime: 130-140's >> 113 >> 129 >> n/c Lowest sugar was 102 >> 69 x1 >> 64; she has hypoglycemia awareness in the 70s. Highest sugar was: 210 >> 185 >> 191 today after waffle + PB.  Glucometer: One Touch.  Pt's meals are: - Breakfast: Eggo waffle and PB, cinnamon, honey - Lunch: Eats out - chick  fil-a - Dinner: Eats out or spaghetti - Snacks: no usually, occas. At night - frozen grapes, chocolate candy, apples, berries She used to drink sodas but stopped.  -No CKD, last BUN/creatinine:  Lab Results  Component Value Date   BUN 12 08/24/2020   BUN 11 08/23/2019   CREATININE 0.73 08/24/2020   CREATININE 0.69 08/23/2019  On lisinopril.  -+ HL; last set of lipids: Lab Results  Component Value Date   CHOL 228 (H) 08/24/2020   CHOL 221 (H) 02/20/2020   CHOL 221 (H) 08/23/2019   Lab Results  Component Value Date   HDL 47 (L) 08/24/2020   HDL 47 (L) 02/20/2020   HDL 45 (L) 08/23/2019   Lab Results  Component Value Date   LDLCALC 145 (H) 08/24/2020   LDLCALC 134 (H) 02/20/2020   LDLCALC 141 (H) 08/23/2019   Lab Results  Component Value Date   TRIG 215 (H) 08/24/2020   TRIG 245 (H) 02/20/2020   TRIG 213 (H) 08/23/2019  Intolerant to statins due to myalgias and muscle weakness. Tried Crestor 3 times a week but could not tolerate it. She could not tolerate Zetia. She tried [employer] x1 mo in 02/2019 >> loose stools and stool incontinence (retrospectively, most likely caused by Metformin)>> stopped.  We restarted this at 600 mg once a day at last visit.  She tolerated this well so  I advised her to increase to 600 mg twice a day.  She is taking this dose now.  - last eye exam was in 02/2020: No DR  - no numbness and tingling in her feet.  Pt has FH of DM in mother, MGM, aunts and uncles, cousins.  She has a history of HTN, distant history of melanoma -> 20 years ago, seasonal depression during winter, lichen sclerosis, osteoarthritis of thumbs.  She also has a history of gout.  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: no weight gain/+ weight loss, no fatigue, no subjective hyperthermia, no subjective hypothermia Eyes: no blurry vision, no xerophthalmia ENT: no sore throat, no nodules palpated in neck, no dysphagia, no odynophagia, no  hoarseness Cardiovascular: no CP/no SOB/no palpitations/no leg swelling Respiratory: no cough/no SOB/no wheezing Gastrointestinal: no N/no V/+ D/no C/no acid reflux Musculoskeletal: no muscle aches/no joint aches Skin: no rashes, no hair loss Neurological: no tremors/no numbness/no tingling/no dizziness, +sciatic pain - resolved  I reviewed pt's medications, allergies, PMH, social hx, family hx, and changes were documented in the history of present illness. Otherwise, unchanged from my initial visit note.  Past Medical History:  Diagnosis Date  . Arthritis   . Blood pressure elevated   . Cancer ([place])    melanoma  . Cholesterol serum elevated   . Diabetes mellitus   . H/O: cesarean section 1976 and 1978  . History of appendectomy age 29  . History of tonsillectomy 1965  . Hypercholesteremia 05/18/2011  . Hyperlipidemia   . Hypertension   . Melanoma ([place]) 05/18/2011   Past Surgical History:  Procedure Laterality Date  . APPENDECTOMY   1958  . [place]  . TONSILLECTOMY  1965   Social History   Socioeconomic History  . Marital status: Married    Spouse name: Not on file  . Number of children: 2  . Years of education: Not on file  . Highest education level: Not on file  Occupational History  .  Retired Marine scientist  Tobacco Use  . Smoking status: Never Smoker  . Smokeless tobacco: Never Used  Substance and Sexual Activity  . Alcohol use: No  . Drug use: No   Current Outpatient Medications on File Prior to Visit  Medication Sig Dispense Refill  . blood glucose meter kit and supplies KIT Dispense based on patient and insurance preference. Use up to four times daily as directed. (FOR ICD-9 250.00, 250.01). 1 each 0  . Clobetasol Prop Emollient Base 0.05 % emollient cream APPLY TO AFFECTED AREA EVERY DAY X 2-3 WEEKS,THEN EVERY OTHER DAY X2-3WEEKS,THEN TWICE PER WEEK 60 g 1  . glucose blood (ONETOUCH ULTRA) test strip USE UP TO 4 TIMES DAILY AS DIRECTED. 100  strip prn  . Lancets (ONETOUCH DELICA PLUS [id number]) MISC USE UP TO FOUR TIMES DAILY AS DIRECTED 100 each prn  . lisinopril (PRINIVIL,ZESTRIL) 10 MG tablet TAKE 1 TABLET BY MOUTH DAILY. (Patient taking differently: Take 5 mg by mouth daily. TAKE 1/2 A TABLET BY MOUTH DAILY) 90 tablet 3  . metFORMIN (GLUCOPHAGE-XR) 500 MG 24 hr tablet TAKE 2 TABLETS (1,000 MG TOTAL) BY MOUTH DAILY WITH SUPPER. 180 tablet 1  . naproxen sodium (ANAPROX) 220 MG tablet Take 220 mg by mouth 2 (two) times daily with a meal.     No current facility-administered medications on file prior to visit.   Allergies  Allergen Reactions  . Codeine   . Epinephrine Palpitations    Takes hours  for reaction of increase pulse and BP.   Family History  Problem Relation Age of Onset  . Diabetes Mother   . Heart disease Mother   . Hypertension Mother   . Hypertension Brother   . Diabetes Maternal Grandmother   . Heart disease Maternal Grandmother   . Hypertension Maternal Grandmother   . Hypertension Maternal Grandfather   . Hypertension Paternal Grandmother   . Hypertension Paternal Grandfather     PE: BP 130/88   Pulse 85   Ht '5\' 3"'  (1.6 m)   Wt 156 lb (70.8 kg)   SpO2 99%   BMI 27.63 kg/m  Wt Readings from Last 3 Encounters:  08/25/20 156 lb (70.8 kg)  04/24/20 155 lb (70.3 kg)  03/16/20 154 lb (69.9 kg)   Constitutional: normal weight, in NAD Eyes: PERRLA, EOMI, no exophthalmos ENT: moist mucous membranes, no thyromegaly, no cervical lymphadenopathy Cardiovascular: RRR, No MRG Respiratory: CTA B Gastrointestinal: abdomen soft, NT, ND, BS+ Musculoskeletal: no deformities, strength intact in all 4 Skin: moist, warm, no rashes Neurological: no tremor with outstretched hands, DTR normal in all 4  ASSESSMENT: 1. DM2, non-insulin-dependent, now controlled, without long-term complications, but with hyperglycemia  2. HL -she has a history of statin induced myopathy  3.  Overweight  PLAN:  1. Patient  with longstanding, uncontrolled, type 2 diabetes, on oral medication with low-dose Metformin ER only.  We had to decrease the dose of Metformin in the past due to loose stools.  She was on Januvia but we stopped it as her sugars were controlled.  At last visit, HbA1c was excellent, at 6.5%, slightly higher than before, though.  Sugars appears to be well controlled until the week prior to our visit, when she became dehydrated and sugars started to increase.  She did have an episode of gastroenteritis at that time.  She increased the dose of Metformin from 1 tablet to 2 tablets daily and her sugars started to improve.  We continued on the higher dose of Metformin until sugars improved to the previous levels and then I advised her to decrease it back to 1 tablet a day if sugars remain controlled. -She recently had another HbA1c which was higher, at 6.8%, but still at goal. -At today's visit, sugars are out of very close to goal.  She had one low blood sugar at 64 after working out in the yard, but otherwise, the lowest blood sugars have been in the 70s.  Highest blood sugars have been in the 160s, but at today's visit, she felt a little dizzy and we checked her CBG: 191.  She just had waffles with peanut butter.  I advised her to try to switch to whole-grain bread or whole-grain waffles.  Otherwise, I did not suggest to change her regimen.  I advised her that if she has more diarrhea with Metformin, she can back LADA 21 tablet a day. - I suggested to:  Patient Instructions  Please continue: - Metformin ER 500 mg 2x a day for few more days (1 week), then decrease to 1x a day  Please return in 4 months with your sugar log.   - advised to check sugars at different times of the day - 1x a day, rotating check times - advised for yearly eye exams >> she is UTD - return to clinic in 4 months  2. HL -Reviewed latest lipid panel from 08/2020: LDL above target, triglycerides above target, HDL low: Lab Results   Component Value Date  CHOL 228 (H) 08/24/2020   HDL 47 (L) 08/24/2020   LDLCALC 145 (H) 08/24/2020   TRIG 215 (H) 08/24/2020   CHOLHDL 4.9 08/24/2020  -She could not tolerate statins due to myalgias.  She tried Crestor 3 times a week and Zetia but could not tolerate either of them.  She refused injectable medications for hyperlipidemia.  I also suggested red yeast rice in the past and she started it once a day.  At last visit I advised her to increase to twice a day.  She continues on this dose now.  3.  Overweight -Before last visit, weight was approximately stable.  She gained 1 pound since last visit. -We are continuing low-dose Metformin, which has a weight stabilizing effect long-term  Philemon Kingdom, MD PhD [facility]

## 2020-08-28 ENCOUNTER — Ambulatory Visit (INDEPENDENT_AMBULATORY_CARE_PROVIDER_SITE_OTHER): Payer: Medicare Other | Admitting: Internal Medicine

## 2020-08-28 ENCOUNTER — Encounter: Payer: Self-pay | Admitting: Internal Medicine

## 2020-08-28 ENCOUNTER — Other Ambulatory Visit: Payer: Self-pay

## 2020-08-28 VITALS — BP 110/88 | HR 88 | Ht 62.5 in | Wt 156.0 lb

## 2020-08-28 DIAGNOSIS — E782 Mixed hyperlipidemia: Secondary | ICD-10-CM | POA: Diagnosis not present

## 2020-08-28 DIAGNOSIS — I350 Nonrheumatic aortic (valve) stenosis: Secondary | ICD-10-CM | POA: Diagnosis not present

## 2020-08-28 DIAGNOSIS — Z85820 Personal history of malignant melanoma of skin: Secondary | ICD-10-CM

## 2020-08-28 DIAGNOSIS — E1169 Type 2 diabetes mellitus with other specified complication: Secondary | ICD-10-CM

## 2020-08-28 DIAGNOSIS — Z1211 Encounter for screening for malignant neoplasm of colon: Secondary | ICD-10-CM | POA: Diagnosis not present

## 2020-08-28 DIAGNOSIS — K580 Irritable bowel syndrome with diarrhea: Secondary | ICD-10-CM

## 2020-08-28 DIAGNOSIS — Z8719 Personal history of other diseases of the digestive system: Secondary | ICD-10-CM

## 2020-08-28 DIAGNOSIS — I10 Essential (primary) hypertension: Secondary | ICD-10-CM | POA: Diagnosis not present

## 2020-08-28 DIAGNOSIS — E785 Hyperlipidemia, unspecified: Secondary | ICD-10-CM

## 2020-08-28 DIAGNOSIS — Z0000 Encounter for general adult medical examination without abnormal findings: Secondary | ICD-10-CM

## 2020-08-28 DIAGNOSIS — Z6828 Body mass index (BMI) 28.0-28.9, adult: Secondary | ICD-10-CM

## 2020-08-28 DIAGNOSIS — R748 Abnormal levels of other serum enzymes: Secondary | ICD-10-CM

## 2020-08-28 LAB — POCT URINALYSIS DIPSTICK
Appearance: NEGATIVE
Bilirubin, UA: NEGATIVE
Glucose, UA: NEGATIVE
Ketones, UA: NEGATIVE
Leukocytes, UA: NEGATIVE
Nitrite, UA: NEGATIVE
Odor: NEGATIVE
Protein, UA: NEGATIVE
Spec Grav, UA: 1.015 (ref 1.010–1.025)
Urobilinogen, UA: 0.2 E.U./dL
pH, UA: 6.5 (ref 5.0–8.0)

## 2020-08-28 NOTE — Progress Notes (Signed)
Subjective:    Patient ID: Courtney Crosby, female    DOB: 1945-11-25, 74 y.o.   MRN: [id number]  HPI  74 year old Female for health maintenance exam, Medicare wellness and evaluation of medical issues.   History of diabetes mellitus now under care of endocrinologist and is stable.  History of hyperlipidemia and refuses statin therapy.  History of essential hypertension treated with lisinopril.  History of obesity and metabolic syndrome.  Has annual diabetic eye exam.-Last 1 was done in May of this year at [facility]  Has declined colonoscopy and Cologuard.  Declines flu vaccine.  Has not been vaccinated for COVID-19.  Has been treated for precancerous lesions on face and arms by dermatologist.  Had herpes zoster left trunk 2006.  Fractured right humerus 2007.  History of appendectomy.  History of ingrown toenail surgery bilaterally.  C-sections in 1976 in 1978.  History of GE reflux.  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  Husband is a retired Higher education careers adviser.  2 adult children, a son and a daughter.  Non-smoker.  Does not consume alcohol.  Family history mother with history of diabetes and hypertension.  Review of Systems Does not want bone density study as she says she will not take bone sparing medication even if she has osteoporosis       Objective:   Physical Exam Blood pressure 110/88 pulse 88 regular pulse oximetry 98% weight 156 pounds BMI 28.08  Skin warm and dry.  Nodes none.  TMs are clear.  Neck is supple without JVD thyromegaly or carotid bruits.  Chest is clear to auscultation without rales or wheezing.  Cardiac exam regular rate and rhythm normal S1 and S2 without murmurs or gallops.  Abdomen soft nondistended without hepatosplenomegaly masses or tenderness.  No lower extremity pitting edema.  Neuro is intact without focal deficits.  Affect thought and judgment are normal.        Assessment & Plan:  Essential hypertension-stable on current regimen  Mixed hyperlipidemia-does not want to be on statin medication  Health maintenance-refuses Covid, flu vaccine.  Declines  colonoscopy.  Declines bone density study.  Type 2 diabetes mellitus followed by endocrinologist at [facility].  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  Remote history of melanoma 1991-no recurrence  History of GE reflux  Plan: She is being seen frequently at Endocrinology.  She will monitor blood pressure and let me know if persistently elevated.  Otherwise return in 1 year or as needed.  Subjective:   Patient presents for Medicare Annual/Subsequent preventive examination.  Review Past Medical/Family/Social: See above   Risk Factors  Current exercise habits: Light exercise Dietary issues discussed: Low-fat low carbohydrate reinforced  Cardiac risk factors: Hyperlipidemia and diabetes  Depression Screen  (Note: if answer to either of the following is "Yes", a more complete depression screening is indicated)   Over the past two weeks, have you felt down, depressed or hopeless? No  Over the past two weeks, have you felt little interest or pleasure in doing things? No Have you lost interest or pleasure in daily life? No Do you often feel hopeless? No Do you cry easily over simple problems? No   Activities of Daily Living  In your present state of health, do you have any difficulty performing the following activities?:   Driving? No  Managing money? No  Feeding  yourself? No  Getting from bed to chair? No  Climbing a flight of stairs? No  Preparing food and eating?: No  Bathing or showering? No  Getting dressed: No  Getting to the toilet? No  Using the toilet:No  Moving around from place to place: No  In the past year have you fallen or had a near fall?:No  Are you sexually active? No   Do you have more than one partner? No   Hearing Difficulties: No  Do you often ask people to speak up or repeat themselves? No  Do you experience ringing or noises in your ears? No  Do you have difficulty understanding soft or whispered voices? No  Do you feel that you have a problem with memory? No Do you often misplace items? No    Home Safety:  Do you have a smoke alarm at your residence? Yes Do you have grab bars in the bathroom?  Yes Do you have throw rugs in your house?  Yes   Cognitive Testing  Alert? Yes Normal Appearance?Yes  Oriented to person? Yes Place? Yes  Time? Yes  Recall of three objects? Yes  Can perform simple calculations? Yes  Displays appropriate judgment?Yes  Can read the correct time from a watch face?Yes   List the Names of Other Physician/Practitioners you currently use:  See referral list for the physicians patient is currently seeing.  Endocrinologist   Review of Systems: See above   Objective:     General appearance: Appears stated age and  obese  Head: Normocephalic, without obvious abnormality, atraumatic  Eyes: conj clear, EOMi PEERLA  Ears: normal TM's and external ear canals both ears  Nose: Nares normal. Septum midline. Mucosa normal. No drainage or sinus tenderness.  Throat: lips, mucosa, and tongue normal; teeth and gums normal  Neck: no adenopathy, no carotid bruit, no JVD, supple, symmetrical, trachea midline and thyroid not enlarged, symmetric, no tenderness/mass/nodules  No CVA tenderness.  Lungs: clear to auscultation bilaterally  Breasts: normal appearance, no masses or tenderness Heart: regular rate and rhythm, S1, S2 normal, no murmur, click, rub or gallop  Abdomen: soft, non-tender; bowel sounds normal; no masses, no organomegaly  Musculoskeletal: ROM normal in all joints, no crepitus, no deformity, Normal muscle strengthen. Back  is symmetric, no curvature. Skin: Skin color, texture, turgor normal. No rashes or lesions   Lymph nodes: Cervical, supraclavicular, and axillary nodes normal.  Neurologic: CN 2 -12 Normal, Normal symmetric reflexes. Normal coordination and gait  Psych: Alert & Oriented x 3, Mood appear stable.    Assessment:    Annual wellness medicare exam   Plan:    During the course of the visit the patient was educated and counseled about appropriate screening and preventive services including:   Declines many preventative services see above for     Patient Instructions (the written plan) was given to the patient.  Medicare Attestation  I have personally reviewed:  The patient's medical and social history  Their use of alcohol, tobacco or illicit drugs  Their current medications and supplements  The patient's functional ability including ADLs,fall risks, home safety risks, cognitive, and hearing and visual impairment  Diet and physical activities  Evidence for depression or mood disorders  The patient's weight, height, BMI, and visual acuity have been recorded in the chart. I have made referrals, counseling, and provided education to the patient based on review of the above and I have provided the patient with a written personalized care plan for preventive  services.

## 2020-10-11 NOTE — Patient Instructions (Signed)
It was a pleasure to see you today.  Return in 1 year or as needed.  Continue current medications.  Continue to work on diet exercise and weight loss.

## 2020-10-14 ENCOUNTER — Other Ambulatory Visit: Payer: Self-pay | Admitting: Internal Medicine

## 2020-10-14 MED FILL — METFORMIN HCL ER 500 MG TB2: 500 | 90 days supply | Qty: 180 | Fill #0

## 2020-11-03 ENCOUNTER — Other Ambulatory Visit: Payer: Self-pay | Admitting: Internal Medicine

## 2020-11-03 MED FILL — ONE TOUCH ULTRA TEST STRIPS: 25 days supply | Qty: 100 | Fill #0

## 2020-12-25 ENCOUNTER — Other Ambulatory Visit: Payer: Self-pay

## 2020-12-25 ENCOUNTER — Encounter: Payer: Self-pay | Admitting: Internal Medicine

## 2020-12-25 ENCOUNTER — Ambulatory Visit: Payer: Medicare Other | Admitting: Internal Medicine

## 2020-12-25 VITALS — BP 128/82 | HR 105 | Ht 62.5 in | Wt 155.8 lb

## 2020-12-25 DIAGNOSIS — E7800 Pure hypercholesterolemia, unspecified: Secondary | ICD-10-CM

## 2020-12-25 DIAGNOSIS — E1165 Type 2 diabetes mellitus with hyperglycemia: Secondary | ICD-10-CM | POA: Diagnosis not present

## 2020-12-25 DIAGNOSIS — E663 Overweight: Secondary | ICD-10-CM | POA: Diagnosis not present

## 2020-12-25 LAB — POCT GLYCOSYLATED HEMOGLOBIN (HGB A1C): Hemoglobin A1C: 6.5 % — AB (ref 4.0–5.6)

## 2020-12-25 NOTE — Progress Notes (Signed)
This visit occurred during the SARS-CoV-2 public health emergency.  Safety protocols were in place, including screening questions prior to the visit, additional usage of staff PPE, and extensive cleaning of exam room while observing appropriate contact time as indicated for disinfecting solutions.   HPI: Courtney Crosby is a 75 y.o.-year-old female, returning for follow-up for DM2, dx in ~2015, non-insulin-dependent, now controlled, without long-term complications.  Last visit 4 months ago.  Interim history: She is more stressed as husband's memory loss is advancing. She is otherwise feeling well, without complaints: No blurry vision, nausea, pain.  Reviewed HbA1c levels: Lab Results  Component Value Date   HGBA1C 6.8 (H) 08/24/2020   HGBA1C 6.5 (A) 04/24/2020   HGBA1C 6.4 (H) 02/20/2020   HGBA1C 6.3 (A) 12/23/2019   HGBA1C 6.4 (H) 08/23/2019   HGBA1C 6.4 (H) 02/26/2019   HGBA1C 6.1 (A) 01/02/2019   HGBA1C 7.8 (H) 08/20/2018   HGBA1C 7.5 (H) 03/06/2018   HGBA1C 8.0 (H) 01/02/2018   She is on - Metformin ER 1000 mg with dinner -started 09/2018 - diarrhea >> we had to reduce the dose >> 500 mg once a day >> 500 mg 2x a day - more formed stools We stopped Januvia 100 mg before b'fast 2/2 $$$-initially had soft stools with this, then they resolved She did not try Metformin >> mother and aunt had CKD while on this.  Pt checks her sugars once a day: - am: 76-147 >> 92, 110-141 >> 79, 111, 129-153, 162 - 2h after b'fast: 107-166 >> 69, 135-159 >> 94-144 >> 130 - before lunch:  106-160 >> 64, 74-123 >> 104-132, 146 - 2h after lunch: 133 >> 161-183 >> 100 >> 114-147, 187 - before dinner:  102-120, 132 >> 116-127 >> 81, 112-147 - 2h after dinner:  123-159, 179 >> 137, 152 >> 177 - bedtime:  107-160, 185 >> 102-144, 165 >> 107, 137 - nighttime: 130-140's >> 113 >> 129 >> n/c Lowest sugar was 102 >> 69 x1 >> 64 >> 79; she has hypoglycemia awareness in the 70s. Highest sugar was: 210 >>  185 >> 191 after waffle + PB >> 187  Glucometer: One Touch.  Pt's meals are: - Breakfast: Eggo waffle and PB, cinnamon, honey - Lunch: Eats out - chick fil-a - Dinner: Eats out or spaghetti - Snacks: no usually, occas. At night - frozen grapes, chocolate candy, apples, berries She used to drink sodas but stopped.  -No CKD, last BUN/creatinine:  Lab Results  Component Value Date   BUN 12 08/24/2020   BUN 11 08/23/2019   CREATININE 0.73 08/24/2020   CREATININE 0.69 08/23/2019  On lisinopril.  -+ HL; last set of lipids: Lab Results  Component Value Date   CHOL 228 (H) 08/24/2020   CHOL 221 (H) 02/20/2020   CHOL 221 (H) 08/23/2019   Lab Results  Component Value Date   HDL 47 (L) 08/24/2020   HDL 47 (L) 02/20/2020   HDL 45 (L) 08/23/2019   Lab Results  Component Value Date   LDLCALC 145 (H) 08/24/2020   LDLCALC 134 (H) 02/20/2020   LDLCALC 141 (H) 08/23/2019   Lab Results  Component Value Date   TRIG 215 (H) 08/24/2020   TRIG 245 (H) 02/20/2020   TRIG 213 (H) 08/23/2019  Intolerant to statins due to myalgias and muscle weakness. Tried Crestor 3 times a week but could not tolerate it. She could not tolerate Zetia. She tried [employer] x1 mo in 02/2019 >> loose  stools and stool incontinence (retrospectively, most likely caused by Metformin)>> stopped.  We restarted this at 600 mg once a day at last visit, and we can increase this to twice a day. - last eye exam was in 02/2020: No DR  - no numbness and tingling in her feet.  Pt has FH of DM in mother, MGM, aunts and uncles, cousins.  She has a history of HTN, distant history of melanoma -> 20 years ago, seasonal depression during winter, lichen sclerosis, osteoarthritis of thumbs.  She also has a history of gout.  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: no weight gain/no weight loss, no fatigue, no subjective hyperthermia, no subjective hypothermia Eyes: no blurry vision, no xerophthalmia ENT: no sore  throat, no nodules palpated in neck, no dysphagia, no odynophagia, no hoarseness Cardiovascular: no CP/no SOB/no palpitations/no leg swelling Respiratory: no cough/no SOB/no wheezing Gastrointestinal: no N/no V/+ looser stools/no C/no acid reflux Musculoskeletal: no muscle aches/no joint aches Skin: no rashes, no hair loss Neurological: no tremors/no numbness/no tingling/no dizziness  I reviewed pt's medications, allergies, PMH, social hx, family hx, and changes were documented in the history of present illness. Otherwise, unchanged from my initial visit note.  Past Medical History:  Diagnosis Date  . Arthritis   . Blood pressure elevated   . Cancer ([place])    melanoma  . Cholesterol serum elevated   . Diabetes mellitus   . H/O: cesarean section 1976 and 1978  . History of appendectomy age 30  . History of tonsillectomy 1965  . Hypercholesteremia 05/18/2011  . Hyperlipidemia   . Hypertension   . Melanoma ([place]) 05/18/2011   Past Surgical History:  Procedure Laterality Date  . APPENDECTOMY   1958  . [place]  . TONSILLECTOMY  1965   Social History   Socioeconomic History  . Marital status: Married    Spouse name: Not on file  . Number of children: 2  . Years of education: Not on file  . Highest education level: Not on file  Occupational History  .  Retired Marine scientist  Tobacco Use  . Smoking status: Never Smoker  . Smokeless tobacco: Never Used  Substance and Sexual Activity  . Alcohol use: No  . Drug use: No   Current Outpatient Medications on File Prior to Visit  Medication Sig Dispense Refill  . blood glucose meter kit and supplies KIT Dispense based on patient and insurance preference. Use up to four times daily as directed. (FOR ICD-9 250.00, 250.01). 1 each 0  . Clobetasol Prop Emollient Base 0.05 % emollient cream APPLY TO AFFECTED AREA EVERY DAY X 2-3 WEEKS,THEN EVERY OTHER DAY X2-3WEEKS,THEN TWICE PER WEEK 60 g 1  . Lancets (ONETOUCH DELICA PLUS  [id number]) MISC USE UP TO FOUR TIMES DAILY AS DIRECTED 100 each prn  . lisinopril (PRINIVIL,ZESTRIL) 10 MG tablet TAKE 1 TABLET BY MOUTH DAILY. (Patient taking differently: Take 5 mg by mouth daily. TAKE 1/2 A TABLET BY MOUTH DAILY) 90 tablet 3  . metFORMIN (GLUCOPHAGE-XR) 500 MG 24 hr tablet TAKE 2 TABLETS (1,000 MG TOTAL) BY MOUTH DAILY WITH SUPPER. 180 tablet 1  . naproxen sodium (ANAPROX) 220 MG tablet Take 220 mg by mouth 2 (two) times daily with a meal.    . ONETOUCH ULTRA test strip USE UP TO 4 TIMES DAILY AS DIRECTED. 100 strip PRN   No current facility-administered medications on file prior to visit.   Allergies  Allergen Reactions  . Codeine   .  Epinephrine Palpitations    Takes hours for reaction of increase pulse and BP.   Family History  Problem Relation Age of Onset  . Diabetes Mother   . Heart disease Mother   . Hypertension Mother   . Hypertension Brother   . Diabetes Maternal Grandmother   . Heart disease Maternal Grandmother   . Hypertension Maternal Grandmother   . Hypertension Maternal Grandfather   . Hypertension Paternal Grandmother   . Hypertension Paternal Grandfather     PE: BP 128/82   Pulse (!) 105   Ht 5' 2.5" (1.588 m)   Wt 155 lb 12.8 oz (70.7 kg)   SpO2 97%   BMI 28.04 kg/m  Wt Readings from Last 3 Encounters:  12/25/20 155 lb 12.8 oz (70.7 kg)  08/28/20 156 lb (70.8 kg)  08/25/20 156 lb (70.8 kg)   Constitutional: normal weight, in NAD Eyes: PERRLA, EOMI, no exophthalmos ENT: moist mucous membranes, no thyromegaly, no cervical lymphadenopathy Cardiovascular: No tachycardia at the time of the exam, RR, No MRG Respiratory: CTA B Gastrointestinal: abdomen soft, NT, ND, BS+ Musculoskeletal: no deformities, strength intact in all 4 Skin: moist, warm, no rashes Neurological: no tremor with outstretched hands, DTR normal in all 4  ASSESSMENT: 1. DM2, non-insulin-dependent, now controlled, without long-term complications, but with  hyperglycemia  2. HL -she has a history of statin induced myopathy  3.  Overweight  PLAN:  1. Patient with longstanding, uncontrolled, type 2 diabetes, on oral antidiabetic regimen with low-dose Metformin ER only.  We had to decrease the dose in the past due to loose stools.  She was on Januvia in the past but stopped as sugars were controlled.  Latest HbA1c was 6.8% before our last visit.  At that time sugars were at or very close to goal so we did not change the regimen but we did discuss about improving diet.  We also discussed that she could reduce the Metformin dose further to only 1 tablet a day if she continues to have loose stools. -At today's visit, sugars are at slightly above target, with the highest values being in the morning.  Upon questioning, she has a snack at night and we discussed about ideally stopping this.  However, if she has to have something, she can get unsalted knots, or vegetables.  She agrees to try this. -We will continue the same regimen.  She has looser stools with Metformin, but tolerable. - I suggested to:  Patient Instructions  Please continue: - Metformin ER 500 mg 2x a day  Please return in 4 months with your sugar log.   - we checked her HbA1c: 6.5% (Improved, excellent) - advised to check sugars at different times of the day - 1x a day, rotating check times - advised for yearly eye exams >> she is UTD and has an appointment coming up. - return to clinic in 4 months  2. HL -Reviewed latest lipid panel from 08/2020: LDL above target, triglycerides high, HDL low: Lab Results  Component Value Date   CHOL 228 (H) 08/24/2020   HDL 47 (L) 08/24/2020   LDLCALC 145 (H) 08/24/2020   TRIG 215 (H) 08/24/2020   CHOLHDL 4.9 08/24/2020  -She could not tolerate statins due to myalgias.  She tried Crestor 3 times a week and Zetia but she could not tolerate either of them.  She refused injectable medications for hyperlipidemia.  We started to read his tries and  she was on 600 mg once a day at last  visit.  We increased it to twice a day.  She tolerates this well.  She would not want to retry statins.  3.  Overweight -Weight stable at last visit -We will continue Metformin, which is a weight stabilizing effect long-term  Philemon Kingdom, MD PhD [facility]

## 2020-12-25 NOTE — Patient Instructions (Addendum)
Please continue: - Metformin ER 500 mg 2x a day  Please return in 4 months with your sugar log.

## 2020-12-25 NOTE — Addendum Note (Signed)
Addended by: Lauralyn Primes on: 12/25/2020 09:48 AM   Modules accepted: Orders

## 2021-01-08 IMAGING — MG DIGITAL SCREENING BILAT W/ TOMO W/ CAD
8 series · 8 of 24 positions shown · non-contrast
Comparison: Previous exam(s).

CLINICAL DATA: Screening.

EXAM:
DIGITAL SCREENING BILATERAL MAMMOGRAM WITH TOMO AND CAD

[L MLO synth-2D]
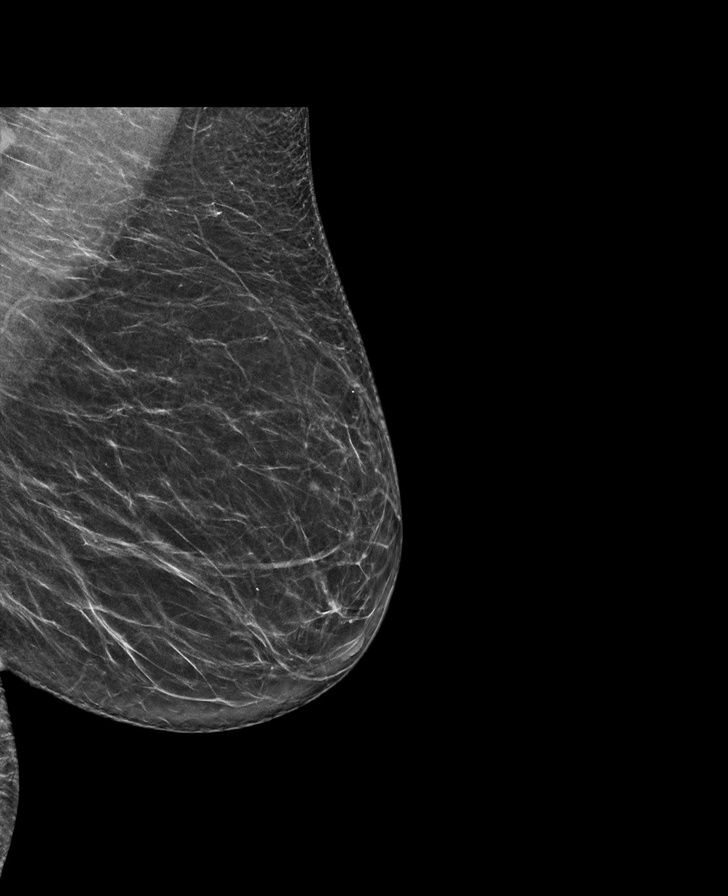

[R MLO synth-2D]
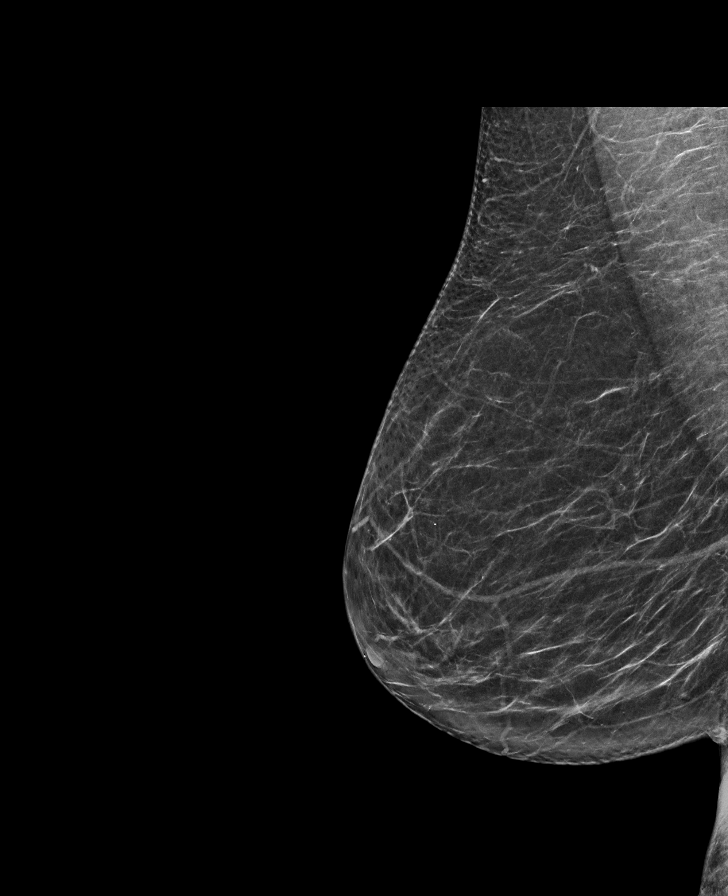

[L CC synth-2D]
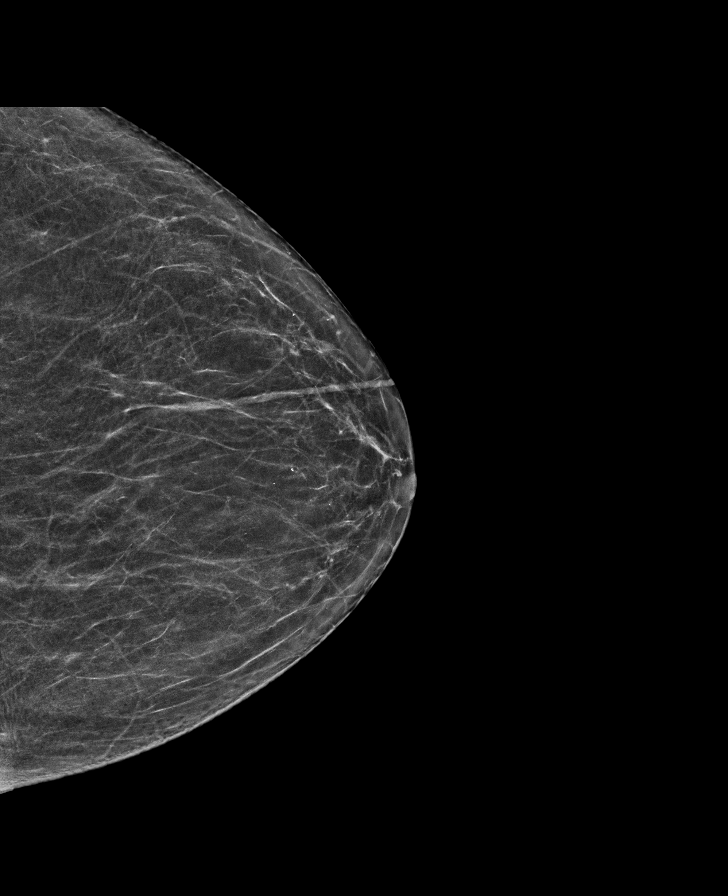

[R CC synth-2D]
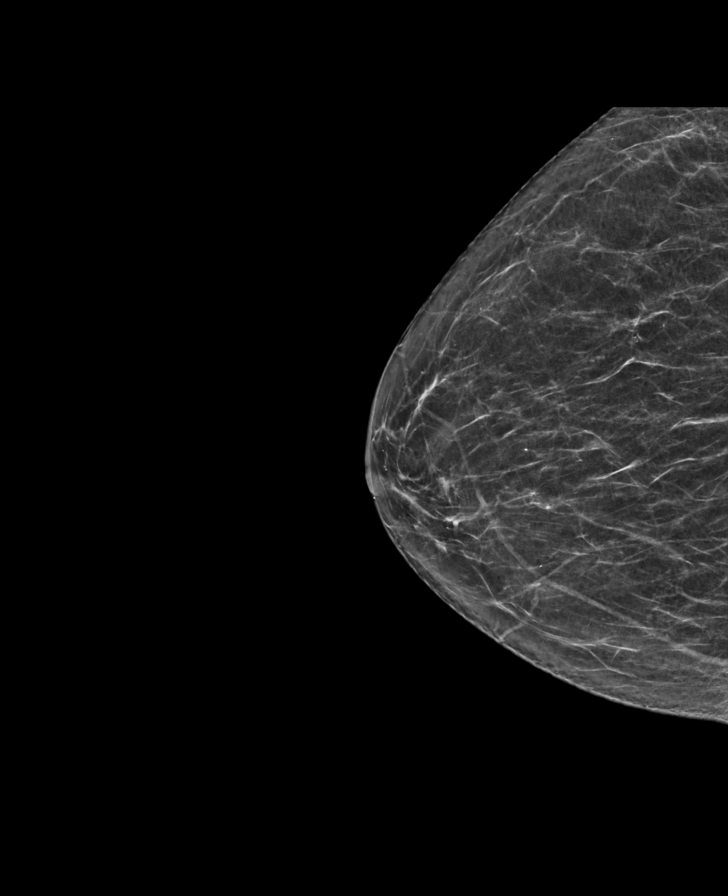

[L CC tomo · tomo slice 31/62.0]
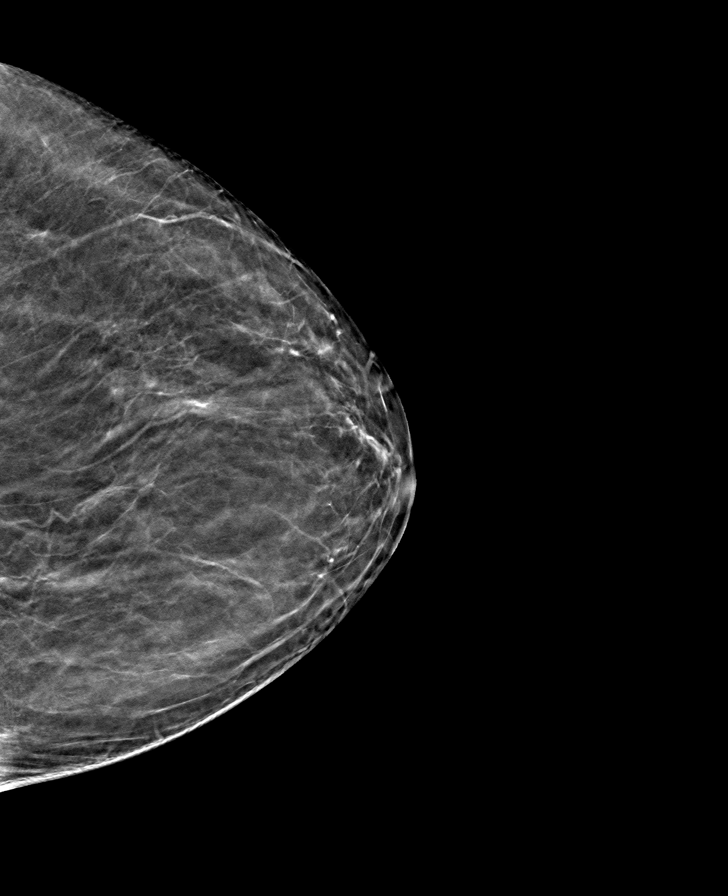

[R CC tomo · tomo slice 29/57.0]
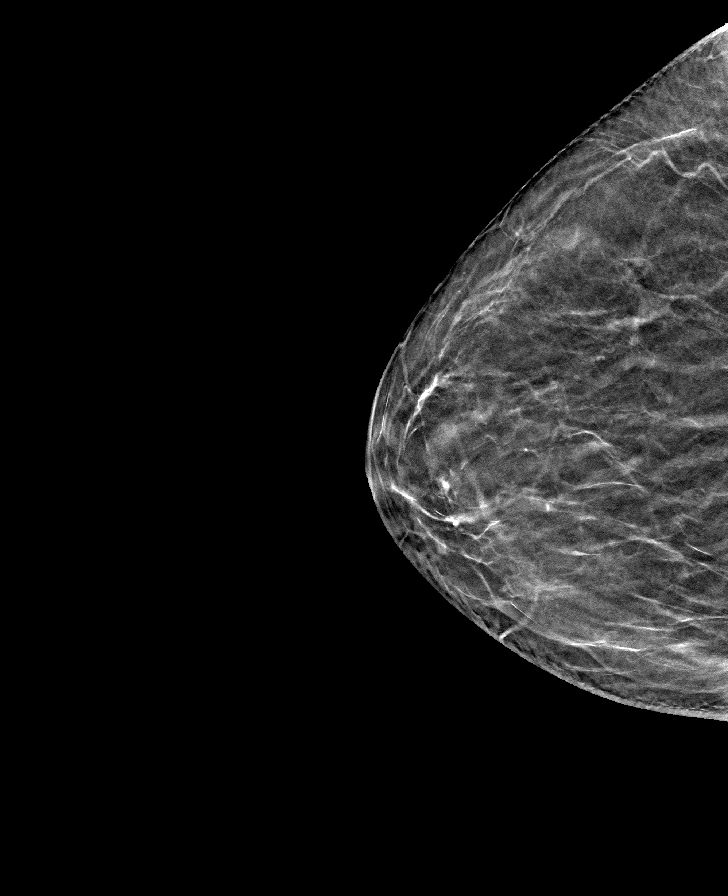

[R MLO tomo · tomo slice 32/63.0]
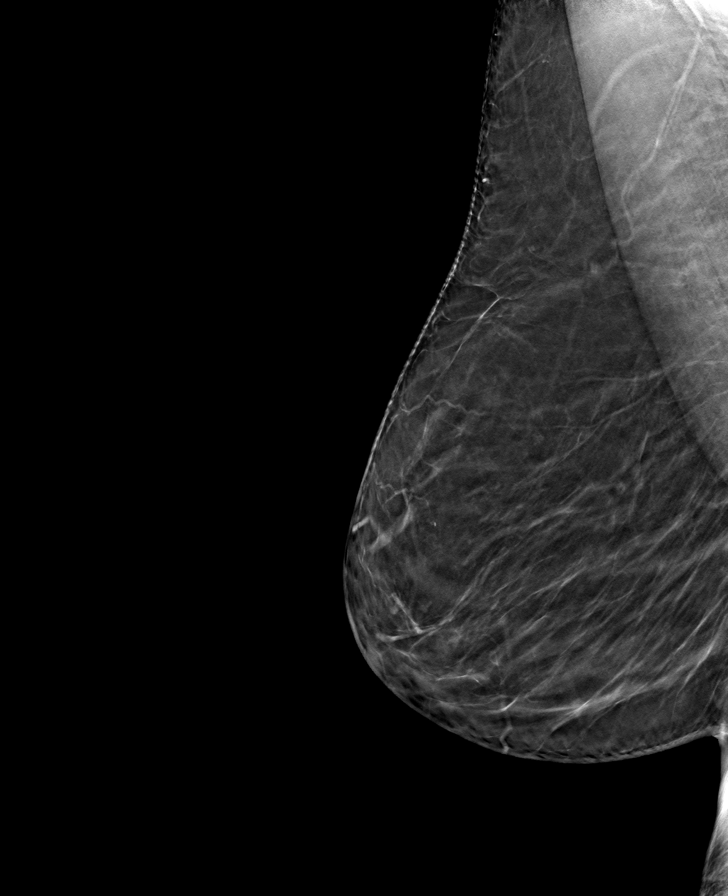

[L MLO tomo · tomo slice 35/68.0]
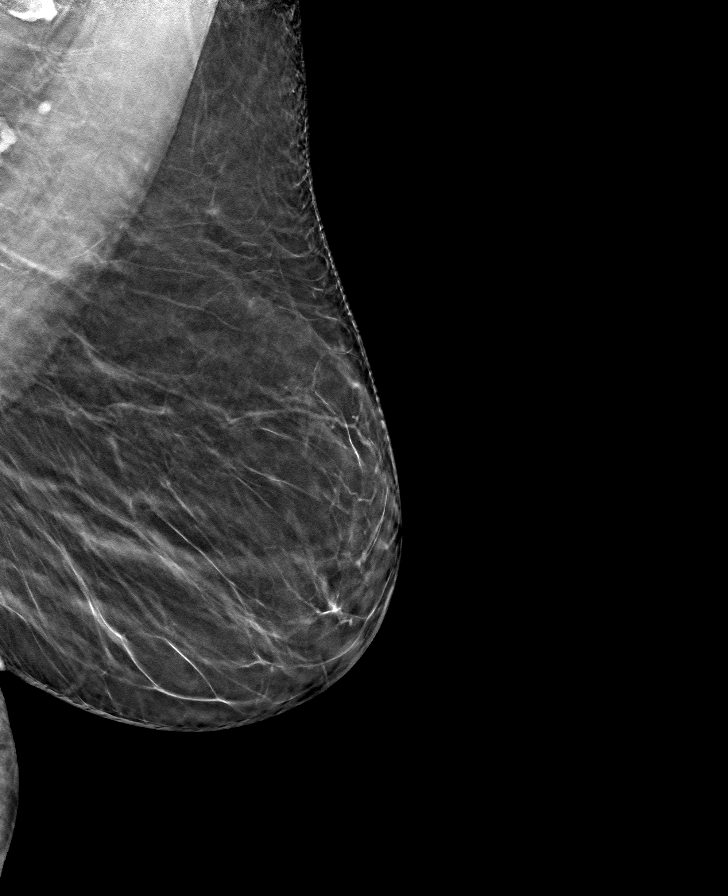

[8 of 24 positions shown; findings below may reference images not displayed]

ACR Breast Density Category b: There are scattered areas of
fibroglandular density.
FINDINGS: There are no findings suspicious for malignancy. Images were
processed with CAD.
IMPRESSION: No mammographic evidence of malignancy. A result letter of this
screening mammogram will be mailed directly to the patient.

RECOMMENDATION:
Screening mammogram in one year. (Code:[id number])

BI-RADS CATEGORY  1: Negative.

## 2021-01-13 ENCOUNTER — Other Ambulatory Visit (HOSPITAL_COMMUNITY): Payer: Self-pay

## 2021-01-13 MED FILL — Metformin HCl Tab ER 24HR 500 MG: ORAL | 90 days supply | Qty: 180 | Fill #0 | Status: AC

## 2021-02-16 LAB — HM DIABETES EYE EXAM

## 2021-02-19 ENCOUNTER — Other Ambulatory Visit (HOSPITAL_COMMUNITY): Payer: Self-pay

## 2021-02-19 MED FILL — Glucose Blood Test Strip: 25 days supply | Qty: 100 | Fill #0 | Status: AC

## 2021-04-15 ENCOUNTER — Other Ambulatory Visit (HOSPITAL_COMMUNITY): Payer: Self-pay

## 2021-04-15 MED ORDER — DOXYCYCLINE HYCLATE 100 MG PO CAPS
100.0000 mg | ORAL_CAPSULE | Freq: Two times a day (BID) | ORAL | 0 refills | Status: DC
  Filled 2021-04-15: qty 10, 5d supply, fill #0

## 2021-04-19 ENCOUNTER — Other Ambulatory Visit: Payer: Self-pay | Admitting: Internal Medicine

## 2021-04-19 ENCOUNTER — Other Ambulatory Visit (HOSPITAL_COMMUNITY): Payer: Self-pay

## 2021-04-19 ENCOUNTER — Other Ambulatory Visit: Payer: Self-pay

## 2021-04-19 MED ORDER — METFORMIN HCL ER 500 MG PO TB24
1000.0000 mg | ORAL_TABLET | Freq: Every day | ORAL | 1 refills | Status: DC
  Filled 2021-04-19: qty 180, 90d supply, fill #0

## 2021-04-30 ENCOUNTER — Other Ambulatory Visit (HOSPITAL_COMMUNITY): Payer: Self-pay

## 2021-04-30 ENCOUNTER — Encounter: Payer: Self-pay | Admitting: Internal Medicine

## 2021-04-30 ENCOUNTER — Ambulatory Visit: Payer: Medicare Other

## 2021-04-30 ENCOUNTER — Other Ambulatory Visit: Payer: Self-pay

## 2021-04-30 VITALS — BP 120/72 | HR 82 | Ht 62.5 in | Wt 155.4 lb

## 2021-04-30 DIAGNOSIS — E1165 Type 2 diabetes mellitus with hyperglycemia: Secondary | ICD-10-CM

## 2021-04-30 DIAGNOSIS — E7800 Pure hypercholesterolemia, unspecified: Secondary | ICD-10-CM | POA: Diagnosis not present

## 2021-04-30 DIAGNOSIS — E663 Overweight: Secondary | ICD-10-CM

## 2021-04-30 LAB — POCT GLYCOSYLATED HEMOGLOBIN (HGB A1C): Hemoglobin A1C: 6.5 % — AB (ref 4.0–5.6)

## 2021-04-30 MED ORDER — METFORMIN HCL ER 500 MG PO TB24
500.0000 mg | ORAL_TABLET | Freq: Two times a day (BID) | ORAL | 3 refills | Status: DC
  Filled 2021-04-30 – 2021-07-19 (×2): qty 180, 90d supply, fill #0
  Filled 2021-10-20: qty 180, 90d supply, fill #1

## 2021-04-30 NOTE — Patient Instructions (Signed)
Please continue: - Metformin ER 500 mg 2x a day  Please return in 4 months with your sugar log.

## 2021-04-30 NOTE — Progress Notes (Signed)
This visit occurred during the SARS-CoV-2 public health emergency.  Safety protocols were in place, including screening questions prior to the visit, additional usage of staff PPE, and extensive cleaning of exam room while observing appropriate contact time as indicated for disinfecting solutions.   HPI: Courtney Crosby is a 75 y.o.-year-old female, returning for follow-up for DM2, dx in ~2015, non-insulin-dependent, now controlled, without long-term complications.  Last visit 4 months ago.  Interim history: She continues to be worried about husband as his memory loss is advancing. She  had a basal cell CA resected from nose >> less active. No increased urination, blurry vision, nausea, chest pain.  Reviewed HbA1c levels: Lab Results  Component Value Date   HGBA1C 6.5 (A) 12/25/2020   HGBA1C 6.8 (H) 08/24/2020   HGBA1C 6.5 (A) 04/24/2020   HGBA1C 6.4 (H) 02/20/2020   HGBA1C 6.3 (A) 12/23/2019   HGBA1C 6.4 (H) 08/23/2019   HGBA1C 6.4 (H) 02/26/2019   HGBA1C 6.1 (A) 01/02/2019   HGBA1C 7.8 (H) 08/20/2018   HGBA1C 7.5 (H) 03/06/2018   She is on - Metformin ER 1000 mg with dinner -started 09/2018 - diarrhea >> we had to reduce the dose >> 500 mg 2x a day We stopped Januvia 100 mg before b'fast 2/2 $$$-initially had soft stools with this, then they resolved She did not try Metformin >> mother and aunt had CKD while on this.  Pt checks her sugars once a day: - am:92, 110-141 >> 79, 111, 129-153, 162 >> 115-143, 152, 171 - 2h after b'fast: 107-166 >> 69, 135-159 >> 94-144 >> 130 >> 92 - before lunch:  106-160 >> 64, 74-123 >> 104-132, 146 >> 114-131 - 2h after lunch: 133 >> 161-183 >> 100 >> 114-147, 187 >> n/c - before dinner:  102-120, 132 >> 116-127 >> 81, 112-147 >> 97-144 - 2h after dinner:  123-159, 179 >> 137, 152 >> 177 >> 109-177 - bedtime:  107-160, 185 >> 102-144, 165 >> 107, 137 >> 124-131 - nighttime: 130-140's >> 113 >> 129 >> n/c Lowest sugar was 102 >> 69 x1 >> 64 >>  79; she has hypoglycemia awareness in the 70s. Highest sugar was: 210 >> 185 >> 191 after waffle + PB >> 187 >> 177.  Glucometer: One Touch.  Pt's meals are: - Breakfast: Eggo waffle and PB, cinnamon, honey - Lunch: Eats out - chick fil-a - Dinner: Eats out or spaghetti - Snacks: no usually, occas. At night - frozen grapes, chocolate candy, apples, berries She used to drink sodas but stopped.  -No CKD, last BUN/creatinine:  Lab Results  Component Value Date   BUN 12 08/24/2020   BUN 11 08/23/2019   CREATININE 0.73 08/24/2020   CREATININE 0.69 08/23/2019  Off lisinopril for 2 years.  -+ HL; last set of lipids: Lab Results  Component Value Date   CHOL 228 (H) 08/24/2020   CHOL 221 (H) 02/20/2020   CHOL 221 (H) 08/23/2019   Lab Results  Component Value Date   HDL 47 (L) 08/24/2020   HDL 47 (L) 02/20/2020   HDL 45 (L) 08/23/2019   Lab Results  Component Value Date   LDLCALC 145 (H) 08/24/2020   LDLCALC 134 (H) 02/20/2020   LDLCALC 141 (H) 08/23/2019   Lab Results  Component Value Date   TRIG 215 (H) 08/24/2020   TRIG 245 (H) 02/20/2020   TRIG 213 (H) 08/23/2019  Intolerant to statins due to myalgias and muscle weakness. Tried Crestor 3 times a week but  could not tolerate it. She could not tolerate Zetia. She tried [employer] x1 mo in 02/2019 >> loose stools and stool incontinence (retrospectively, most likely caused by Metformin)>> stopped.  We restarted this at 600 mg once a day at last visit, and we can increase this to twice a day.  - last eye exam was in 02/2021: No DR  - no numbness and tingling in her feet.  Pt has FH of DM in mother, MGM, aunts and uncles, cousins.  She has a history of HTN, distant history of melanoma -> 20 years ago, seasonal depression during winter, lichen sclerosis, osteoarthritis of thumbs.  She also has a history of gout.  She is a retired Marine scientist.  ROS: + see HPI  Past Medical History:  Diagnosis Date   Arthritis    Blood  pressure elevated    Cancer (HCC)    melanoma   Cholesterol serum elevated    Diabetes mellitus    H/O: cesarean section 1976 and 1978   History of appendectomy age 88   History of tonsillectomy 1965   Hypercholesteremia 05/18/2011   Hyperlipidemia    Hypertension    Melanoma ([place]) 05/18/2011   Past Surgical History:  Procedure Laterality Date   [place] and [place]   Social History   Socioeconomic History   Marital status: Married    Spouse name: Not on file   Number of children: 2   Years of education: Not on file   Highest education level: Not on file  Occupational History    Retired Marine scientist  Tobacco Use   Smoking status: Never Smoker   Smokeless tobacco: Never Used  Substance and Sexual Activity   Alcohol use: No   Drug use: No   Current Outpatient Medications on File Prior to Visit  Medication Sig Dispense Refill   blood glucose meter kit and supplies KIT Dispense based on patient and insurance preference. Use up to four times daily as directed. (FOR ICD-9 250.00, 250.01). 1 each 0   Clobetasol Prop Emollient Base 0.05 % emollient cream APPLY TO AFFECTED AREA EVERY DAY X 2-3 WEEKS,THEN EVERY OTHER DAY X2-3WEEKS,THEN TWICE PER WEEK 60 g 1   doxycycline (VIBRAMYCIN) 100 MG capsule Take 1 capsule (100 mg total) by mouth 2 (two) times daily with food. 10 capsule 0   glucose blood test strip USE UP TO 4 TIMES DAILY AS DIRECTED. 100 strip 99   Lancets (ONETOUCH DELICA PLUS [id number]) MISC USE UP TO FOUR TIMES DAILY AS DIRECTED 100 each 99   Lancets (ONETOUCH DELICA PLUS [id number]) MISC USE UP TO FOUR TIMES DAILY AS DIRECTED 100 each prn   lisinopril (PRINIVIL,ZESTRIL) 10 MG tablet TAKE 1 TABLET BY MOUTH DAILY. (Patient taking differently: Take 5 mg by mouth daily. TAKE 1/2 A TABLET BY MOUTH DAILY) 90 tablet 3   metFORMIN (GLUCOPHAGE-XR) 500 MG 24 hr tablet Take 2 tablets (1,000 mg total) by mouth daily with supper. 180  tablet 1   naproxen sodium (ANAPROX) 220 MG tablet Take 220 mg by mouth 2 (two) times daily with a meal.     No current facility-administered medications on file prior to visit.   Allergies  Allergen Reactions   Codeine    Epinephrine Palpitations    Takes hours for reaction of increase pulse and BP.   Family History  Problem Relation Age of Onset   Diabetes Mother    Heart disease  Mother    Hypertension Mother    Hypertension Brother    Diabetes Maternal Grandmother    Heart disease Maternal Grandmother    Hypertension Maternal Grandmother    Hypertension Maternal Grandfather    Hypertension Paternal Grandmother    Hypertension Paternal Grandfather     PE: BP 120/72 (BP Location: Right Arm, Patient Position: Sitting, Cuff Size: Normal)   Pulse 82   Ht 5' 2.5" (1.588 m)   Wt 155 lb 6.4 oz (70.5 kg)   SpO2 97%   BMI 27.97 kg/m  Wt Readings from Last 3 Encounters:  04/30/21 155 lb 6.4 oz (70.5 kg)  12/25/20 155 lb 12.8 oz (70.7 kg)  08/28/20 156 lb (70.8 kg)   Constitutional: normal weight, in NAD Eyes: PERRLA, EOMI, no exophthalmos ENT: moist mucous membranes, no thyromegaly, no cervical lymphadenopathy Cardiovascular: RRR, No MRG Respiratory: CTA B Gastrointestinal: abdomen soft, NT, ND, BS+ Musculoskeletal: no deformities, strength intact in all 4 Skin: moist, warm, no rashes Neurological: no tremor with outstretched hands, DTR normal in all 4  ASSESSMENT: 1. DM2, non-insulin-dependent, now controlled, without long-term complications, but with hyperglycemia  2. HL -she has a history of statin induced myopathy  3.  Overweight  PLAN:  1. Patient with longstanding, uncontrolled, type 2 diabetes, on oral antidiabetic regimen with low-dose metformin ER only.  We had to decrease the dose in the past due to loose stools.  She was on Januvia in the past but stopped due to good control.  HbA1c was improved at last visit, to 6.5%, at goal.  Sugars were at or slightly  above target in the morning but they were better later in the day.  She had a snack at night and we discussed about stopping this.  If hungry, I gave her examples of healthier snacks. -At today's visit, sugars are better in the morning, with the exception of the last 3 days, after her nose skin cancer surgery.  She had a hard time after the surgery as she was not able to be active or even bend over.  She also did not stay very well-hydrated.  However, almost all of the other blood sugars are at goal, so for now, I did not suggest any other changes.  We did discuss about reducing sweets.  She is now eating 2 Oreos a day and we discussed that she may need to go stop these, unfortunately. - I suggested to:  Patient Instructions  Please continue: - Metformin ER 500 mg 2x a day  Please return in 4 months with your sugar log.   - we checked her HbA1c: 6.5% (stable) - advised to check sugars at different times of the day - 1x a day, rotating check times - advised for yearly eye exams >> she is UTD - return to clinic in 4 months  2. HL -Reviewed latest lipid panel from 08/2020: LDL above target, triglycerides high, HDL low Lab Results  Component Value Date   CHOL 228 (H) 08/24/2020   HDL 47 (L) 08/24/2020   LDLCALC 145 (H) 08/24/2020   TRIG 215 (H) 08/24/2020   CHOLHDL 4.9 08/24/2020  -She could not tolerate statins due to muscle aches.  She tried Crestor 3 times a week and Zetia but she could not tolerate either of them.  She refused injectable medications.  We started her red yeast rice, currently at 600 mg twice a day.  She tolerates this well.  3.  Overweight -Weight was stable at last visit -Continue metformin  which has an appetite suppressant effect -Weight stable at this visit  Philemon Kingdom, MD PhD [facility] Endocrinology

## 2021-05-25 ENCOUNTER — Other Ambulatory Visit (HOSPITAL_COMMUNITY): Payer: Self-pay

## 2021-05-25 MED FILL — Glucose Blood Test Strip: 25 days supply | Qty: 100 | Fill #1 | Status: AC

## 2021-05-25 MED FILL — Lancets: 25 days supply | Qty: 100 | Fill #0 | Status: AC

## 2021-05-26 ENCOUNTER — Other Ambulatory Visit (HOSPITAL_COMMUNITY): Payer: Self-pay

## 2021-05-31 ENCOUNTER — Other Ambulatory Visit: Payer: Self-pay | Admitting: Internal Medicine

## 2021-05-31 DIAGNOSIS — Z1231 Encounter for screening mammogram for malignant neoplasm of breast: Secondary | ICD-10-CM

## 2021-06-02 ENCOUNTER — Ambulatory Visit
Admission: RE | Admit: 2021-06-02 | Discharge: 2021-06-02 | Disposition: A | Discharge status: Home / Self Care | Payer: Medicare Other | Source: Ambulatory Visit | Attending: Internal Medicine | Admitting: Internal Medicine

## 2021-06-02 ENCOUNTER — Other Ambulatory Visit: Payer: Self-pay

## 2021-06-02 DIAGNOSIS — Z1231 Encounter for screening mammogram for malignant neoplasm of breast: Secondary | ICD-10-CM

## 2021-06-15 ENCOUNTER — Other Ambulatory Visit: Payer: Self-pay | Admitting: Internal Medicine

## 2021-07-19 ENCOUNTER — Other Ambulatory Visit (HOSPITAL_COMMUNITY): Payer: Self-pay

## 2021-08-23 ENCOUNTER — Ambulatory Visit: Payer: Medicare Other | Admitting: Internal Medicine

## 2021-08-23 ENCOUNTER — Encounter: Payer: Self-pay | Admitting: Internal Medicine

## 2021-08-23 ENCOUNTER — Other Ambulatory Visit: Payer: Self-pay

## 2021-08-23 VITALS — BP 140/100 | HR 91 | Ht 62.5 in | Wt 155.0 lb

## 2021-08-23 DIAGNOSIS — E1165 Type 2 diabetes mellitus with hyperglycemia: Secondary | ICD-10-CM

## 2021-08-23 DIAGNOSIS — E7800 Pure hypercholesterolemia, unspecified: Secondary | ICD-10-CM

## 2021-08-23 DIAGNOSIS — E663 Overweight: Secondary | ICD-10-CM

## 2021-08-23 LAB — POCT GLYCOSYLATED HEMOGLOBIN (HGB A1C): Hemoglobin A1C: 6.8 % — AB (ref 4.0–5.6)

## 2021-08-23 NOTE — Progress Notes (Signed)
This visit occurred during the SARS-CoV-2 public health emergency.  Safety protocols were in place, including screening questions prior to the visit, additional usage of staff PPE, and extensive cleaning of exam room while observing appropriate contact time as indicated for disinfecting solutions.   HPI: Courtney Crosby is a 75 y.o.-year-old female, returning for follow-up for DM2, dx in ~2015, non-insulin-dependent, now controlled, without long-term complications.  Last visit 3.5 months ago.  Interim history: She continues to be stressed about her husband having progressive memory loss. No increased urination, blurry vision, nausea, chest pain.  Reviewed HbA1c levels: Lab Results  Component Value Date   HGBA1C 6.5 (A) 04/30/2021   HGBA1C 6.5 (A) 12/25/2020   HGBA1C 6.8 (H) 08/24/2020   HGBA1C 6.5 (A) 04/24/2020   HGBA1C 6.4 (H) 02/20/2020   HGBA1C 6.3 (A) 12/23/2019   HGBA1C 6.4 (H) 08/23/2019   HGBA1C 6.4 (H) 02/26/2019   HGBA1C 6.1 (A) 01/02/2019   HGBA1C 7.8 (H) 08/20/2018   She is on - Metformin ER 1000 mg with dinner -started 09/2018 - diarrhea >> we had to reduce the dose >> 500 mg 2x a day We stopped Januvia 100 mg before b'fast 2/2 $$$-initially had soft stools with this, then they resolved She did not try Metformin >> mother and aunt had CKD while on this.  Pt checks her sugars once a day: - am: 79, 111, 129-153, 162 >> 115-143, 152, 171 >> 111-146 - 2h after b'fast: 69, 135-159 >> 94-144 >> 130 >> 92 >> 123 - before lunch:  104-132, 146 >> 114-131 >> 91-125, 146 - 2h after lunch: 161-183 >> 100 >> 114-147, 187 >> n/c  - before dinner: 116-127 >> 81, 112-147 >> 97-144 >> 106-139 - 2h after dinner: 137, 152 >> 177 >> 109-177 >> n/c - bedtime:   107, 137 >> 124-131 >> 107, 113, 172 (no medicines) - nighttime: 130-140's >> 113 >> 129 >> n/c Lowest sugar was 102 >> 69 x1 >> 64 >> 79; she has hypoglycemia awareness in the 70s. Highest sugar was: 210 >> .Marland Kitchen.187 >> 177 >>  172.  Glucometer: One Touch.  Pt's meals are: - Breakfast: Eggo waffle and PB, cinnamon, honey - Lunch: Eats out - chick fil-a - Dinner: Eats out or spaghetti - Snacks: no usually, occas. At night - frozen grapes, chocolate candy, apples, berries She used to drink sodas but stopped.  -No CKD, last BUN/creatinine:  Lab Results  Component Value Date   BUN 12 08/24/2020   BUN 11 08/23/2019   CREATININE 0.73 08/24/2020   CREATININE 0.69 08/23/2019  Off lisinopril for 2 years.  -+ HL; last set of lipids: Lab Results  Component Value Date   CHOL 228 (H) 08/24/2020   CHOL 221 (H) 02/20/2020   CHOL 221 (H) 08/23/2019   Lab Results  Component Value Date   HDL 47 (L) 08/24/2020   HDL 47 (L) 02/20/2020   HDL 45 (L) 08/23/2019   Lab Results  Component Value Date   LDLCALC 145 (H) 08/24/2020   LDLCALC 134 (H) 02/20/2020   LDLCALC 141 (H) 08/23/2019   Lab Results  Component Value Date   TRIG 215 (H) 08/24/2020   TRIG 245 (H) 02/20/2020   TRIG 213 (H) 08/23/2019  Intolerant to statins due to myalgias and muscle weakness. Tried Crestor 3 times a week but could not tolerate it. She could not tolerate Zetia. She tried [employer] Rice x1 mo in 02/2019 >> loose stools and stool incontinence (retrospectively, most  likely caused by Metformin)>> stopped.  We restarted this at 600 mg once a day at last visit, and we can increase this to twice a day.  - last eye exam was in 02/2021: No DR  - no numbness and tingling in her feet.  Pt has FH of DM in mother, MGM, aunts and uncles, cousins.  She has a history of HTN, distant history of melanoma -> 20 years ago, seasonal depression during winter, lichen sclerosis, osteoarthritis of thumbs.  She also has a history of gout.  She is a retired Marine scientist.  ROS: + see HPI  Past Medical History:  Diagnosis Date   Arthritis    Blood pressure elevated    Cancer (HCC)    melanoma   Cholesterol serum elevated    Diabetes mellitus    H/O:  cesarean section 1976 and 1978   History of appendectomy age 64   History of tonsillectomy 1965   Hypercholesteremia 05/18/2011   Hyperlipidemia    Hypertension    Melanoma ([place]) 05/18/2011   Past Surgical History:  Procedure Laterality Date   [place] and [place]   Social History   Socioeconomic History   Marital status: Married    Spouse name: Not on file   Number of children: 2   Years of education: Not on file   Highest education level: Not on file  Occupational History    Retired Marine scientist  Tobacco Use   Smoking status: Never Smoker   Smokeless tobacco: Never Used  Substance and Sexual Activity   Alcohol use: No   Drug use: No   Current Outpatient Medications on File Prior to Visit  Medication Sig Dispense Refill   blood glucose meter kit and supplies KIT Dispense based on patient and insurance preference. Use up to four times daily as directed. (FOR ICD-9 250.00, 250.01). 1 each 0   Clobetasol Prop Emollient Base (CLOBETASOL PROPIONATE E) 0.05 % emollient cream APPLY TO AFFECTED AREA EVERY DAY X 2-3 WEEKS,THEN EVERY OTHER DAY X2-3WEEKS,THEN TWICE PER WEEK 60 g 1   doxycycline (VIBRAMYCIN) 100 MG capsule Take 1 capsule (100 mg total) by mouth 2 (two) times daily with food. (Patient not taking: Reported on 04/30/2021) 10 capsule 0   glucose blood test strip USE UP TO 4 TIMES DAILY AS DIRECTED. 100 strip 99   Lancets (ONETOUCH DELICA PLUS [id number]) MISC USE UP TO FOUR TIMES DAILY AS DIRECTED 100 each prn   lisinopril (PRINIVIL,ZESTRIL) 10 MG tablet TAKE 1 TABLET BY MOUTH DAILY. (Patient not taking: Reported on 04/30/2021) 90 tablet 3   metFORMIN (GLUCOPHAGE-XR) 500 MG 24 hr tablet Take 1 tablet (500 mg total) by mouth in the morning and at bedtime. 180 tablet 3   naproxen sodium (ANAPROX) 220 MG tablet Take 220 mg by mouth 2 (two) times daily with a meal.     No current facility-administered medications on file prior to visit.    Allergies  Allergen Reactions   Codeine    Epinephrine Palpitations    Takes hours for reaction of increase pulse and BP.   Family History  Problem Relation Age of Onset   Diabetes Mother    Heart disease Mother    Hypertension Mother    Hypertension Brother    Diabetes Maternal Grandmother    Heart disease Maternal Grandmother    Hypertension Maternal Grandmother    Hypertension Maternal Grandfather    Hypertension Paternal Grandmother  Hypertension Paternal Grandfather     PE: BP (!) 140/100 (BP Location: Right Arm, Patient Position: Sitting, Cuff Size: Normal)   Pulse 91   Ht 5' 2.5" (1.588 m)   Wt 155 lb (70.3 kg)   SpO2 97%   BMI 27.90 kg/m  Wt Readings from Last 3 Encounters:  08/23/21 155 lb (70.3 kg)  04/30/21 155 lb 6.4 oz (70.5 kg)  12/25/20 155 lb 12.8 oz (70.7 kg)   Constitutional: normal weight, in NAD Eyes: PERRLA, EOMI, no exophthalmos ENT: moist mucous membranes, no thyromegaly, no cervical lymphadenopathy Cardiovascular: RRR, No MRG Respiratory: CTA B Gastrointestinal: abdomen soft, NT, ND, BS+ Musculoskeletal: no deformities, strength intact in all 4 Skin: moist, warm, no rashes Neurological: no tremor with outstretched hands, DTR normal in all 4  ASSESSMENT: 1. DM2, non-insulin-dependent, now controlled, without long-term complications, but with hyperglycemia  2. HL -she has a history of statin induced myopathy  3.  Overweight  PLAN:  1. Patient with longstanding, previously uncontrolled type 2 diabetes, on oral antidiabetic regimen with metformin ER half maximal dose.  We had to decrease the dose in the past due to loose stools.  She was on Januvia in the past but this was stopped due to good control.  HbA1c then improved at 6.5% and remained at this level at last visit.  At last visit, sugars were better in the morning.  We discussed about reducing sweets (she was eating 2 Oreos a day) but otherwise we did not change her regimen. - at  this visit, blood sugars remain well controlled, without significant hyperglycemic spikes and without lows.  For now, there is no need to change her regimen. - I suggested to:  Patient Instructions  Please continue: - Metformin ER 500 mg 2x a day  Please return in 4 months with your sugar log.   - we checked her HbA1c: 6.8% (higher) - advised to check sugars at different times of the day - 1x a day, rotating check times - advised for yearly eye exams >> she is UTD - has APE with PCP coming up next week - return to clinic in 4 months  2. HL -Reviewed latest lipid panel from 08/2020: LDL above target, triglycerides high, HDL low: Lab Results  Component Value Date   CHOL 228 (H) 08/24/2020   HDL 47 (L) 08/24/2020   LDLCALC 145 (H) 08/24/2020   TRIG 215 (H) 08/24/2020   CHOLHDL 4.9 08/24/2020  -She could not tolerate statins due to muscle aches.  She tried Crestor 3 times a week and Zetia but she could not tolerate either of them.  She refused injectable medications.  We started her on red yeast rice and she is now taking 600 mg twice a day with good tolerance now. -She is due for another lipid panel.  3.  Overweight -Weight was stable at last visit and again now -Continue metformin which has an appetite suppressant effect  Refuses a flu shot today.  Philemon Kingdom, MD PhD [facility] Endocrinology

## 2021-08-23 NOTE — Patient Instructions (Signed)
Please continue: - Metformin ER 500 mg 2x a day  Please return in 4 months with your sugar log.

## 2021-08-30 ENCOUNTER — Telehealth: Payer: Self-pay | Admitting: Internal Medicine

## 2021-08-30 ENCOUNTER — Other Ambulatory Visit: Payer: Self-pay

## 2021-08-30 ENCOUNTER — Other Ambulatory Visit: Payer: Medicare Other | Admitting: Internal Medicine

## 2021-08-30 DIAGNOSIS — Z0000 Encounter for general adult medical examination without abnormal findings: Secondary | ICD-10-CM

## 2021-08-30 DIAGNOSIS — E1165 Type 2 diabetes mellitus with hyperglycemia: Secondary | ICD-10-CM

## 2021-08-30 DIAGNOSIS — I10 Essential (primary) hypertension: Secondary | ICD-10-CM

## 2021-08-30 DIAGNOSIS — E785 Hyperlipidemia, unspecified: Secondary | ICD-10-CM

## 2021-08-30 DIAGNOSIS — Z1329 Encounter for screening for other suspected endocrine disorder: Secondary | ICD-10-CM

## 2021-08-30 DIAGNOSIS — E1169 Type 2 diabetes mellitus with other specified complication: Secondary | ICD-10-CM

## 2021-08-30 NOTE — Telephone Encounter (Signed)
[facility] [place] ID [phone]  Order # [id number]  Order expired patient never sent back to lab to be processed.

## 2021-08-31 ENCOUNTER — Ambulatory Visit (INDEPENDENT_AMBULATORY_CARE_PROVIDER_SITE_OTHER): Payer: Medicare Other | Admitting: Internal Medicine

## 2021-08-31 ENCOUNTER — Encounter: Payer: Self-pay | Admitting: Internal Medicine

## 2021-08-31 VITALS — BP 130/88 | HR 64 | Temp 97.4°F | Ht 62.5 in | Wt 153.0 lb

## 2021-08-31 DIAGNOSIS — Z85820 Personal history of malignant melanoma of skin: Secondary | ICD-10-CM

## 2021-08-31 DIAGNOSIS — E785 Hyperlipidemia, unspecified: Secondary | ICD-10-CM

## 2021-08-31 DIAGNOSIS — R748 Abnormal levels of other serum enzymes: Secondary | ICD-10-CM

## 2021-08-31 DIAGNOSIS — I10 Essential (primary) hypertension: Secondary | ICD-10-CM | POA: Diagnosis not present

## 2021-08-31 DIAGNOSIS — Z8719 Personal history of other diseases of the digestive system: Secondary | ICD-10-CM

## 2021-08-31 DIAGNOSIS — R011 Cardiac murmur, unspecified: Secondary | ICD-10-CM

## 2021-08-31 DIAGNOSIS — Z6827 Body mass index (BMI) 27.0-27.9, adult: Secondary | ICD-10-CM

## 2021-08-31 DIAGNOSIS — E1169 Type 2 diabetes mellitus with other specified complication: Secondary | ICD-10-CM | POA: Diagnosis not present

## 2021-08-31 DIAGNOSIS — Z0000 Encounter for general adult medical examination without abnormal findings: Secondary | ICD-10-CM | POA: Diagnosis not present

## 2021-08-31 DIAGNOSIS — Z1211 Encounter for screening for malignant neoplasm of colon: Secondary | ICD-10-CM

## 2021-08-31 DIAGNOSIS — I350 Nonrheumatic aortic (valve) stenosis: Secondary | ICD-10-CM | POA: Diagnosis not present

## 2021-08-31 DIAGNOSIS — R319 Hematuria, unspecified: Secondary | ICD-10-CM

## 2021-08-31 DIAGNOSIS — K580 Irritable bowel syndrome with diarrhea: Secondary | ICD-10-CM

## 2021-08-31 DIAGNOSIS — E782 Mixed hyperlipidemia: Secondary | ICD-10-CM

## 2021-08-31 LAB — HEMOCCULT GUIAC POC 1CARD (OFFICE): Fecal Occult Blood, POC: NEGATIVE

## 2021-08-31 LAB — COMPLETE METABOLIC PANEL WITH GFR
AG Ratio: 1.5 (calc) (ref 1.0–2.5)
ALT: 14 U/L (ref 6–29)
AST: 15 U/L (ref 10–35)
Albumin: 4.3 g/dL (ref 3.6–5.1)
Alkaline phosphatase (APISO): 51 U/L (ref 37–153)
BUN: 12 mg/dL (ref 7–25)
CO2: 29 mmol/L (ref 20–32)
Calcium: 9.9 mg/dL (ref 8.6–10.4)
Chloride: 102 mmol/L (ref 98–110)
Creat: 0.69 mg/dL (ref 0.60–1.00)
Globulin: 2.8 g/dL (calc) (ref 1.9–3.7)
Glucose, Bld: 143 mg/dL — ABNORMAL HIGH (ref 65–99)
Potassium: 4.7 mmol/L (ref 3.5–5.3)
Sodium: 142 mmol/L (ref 135–146)
Total Bilirubin: 0.4 mg/dL (ref 0.2–1.2)
Total Protein: 7.1 g/dL (ref 6.1–8.1)
eGFR: 89 mL/min/{1.73_m2} (ref >=60)

## 2021-08-31 LAB — TSH: TSH: 1.43 mIU/L (ref 0.40–4.50)

## 2021-08-31 LAB — POCT URINALYSIS DIPSTICK
Bilirubin, UA: NEGATIVE
Glucose, UA: NEGATIVE
Ketones, UA: NEGATIVE
Nitrite, UA: NEGATIVE
Protein, UA: NEGATIVE
Spec Grav, UA: 1.01 (ref 1.010–1.025)
Urobilinogen, UA: 0.2 E.U./dL
pH, UA: 7 (ref 5.0–8.0)

## 2021-08-31 LAB — HEMOGLOBIN A1C
Hgb A1c MFr Bld: 6.8 % of total Hgb — ABNORMAL HIGH (ref <5.7)
Mean Plasma Glucose: 148 mg/dL
eAG (mmol/L): 8.2 mmol/L

## 2021-08-31 LAB — CBC WITH DIFFERENTIAL/PLATELET
Absolute Monocytes: 552 cells/uL (ref 200–950)
Basophils Absolute: 60 cells/uL (ref 0–200)
Basophils Relative: 1 %
Eosinophils Absolute: 132 cells/uL (ref 15–500)
Eosinophils Relative: 2.2 %
HCT: 45.9 % — ABNORMAL HIGH (ref 35.0–45.0)
Hemoglobin: 15.6 g/dL — ABNORMAL HIGH (ref 11.7–15.5)
Lymphs Abs: 2088 cells/uL (ref 850–3900)
MCH: 29.9 pg (ref 27.0–33.0)
MCHC: 34 g/dL (ref 32.0–36.0)
MCV: 87.9 fL (ref 80.0–100.0)
MPV: 11.8 fL (ref 7.5–12.5)
Monocytes Relative: 9.2 %
Neutro Abs: 3168 cells/uL (ref 1500–7800)
Neutrophils Relative %: 52.8 %
Platelets: 300 10*3/uL (ref 140–400)
RBC: 5.22 10*6/uL — ABNORMAL HIGH (ref 3.80–5.10)
RDW: 12.5 % (ref 11.0–15.0)
Total Lymphocyte: 34.8 %
WBC: 6 10*3/uL (ref 3.8–10.8)

## 2021-08-31 LAB — LIPID PANEL
Cholesterol: 215 mg/dL — ABNORMAL HIGH (ref <200)
HDL: 47 mg/dL — ABNORMAL LOW (ref >=50)
LDL Cholesterol (Calc): 141 mg/dL (calc) — ABNORMAL HIGH
Non-HDL Cholesterol (Calc): 168 mg/dL (calc) — ABNORMAL HIGH (ref <130)
Total CHOL/HDL Ratio: 4.6 (calc) (ref <5.0)
Triglycerides: 147 mg/dL (ref <150)

## 2021-08-31 LAB — MICROALBUMIN / CREATININE URINE RATIO
Creatinine, Urine: 32 mg/dL (ref 20–275)
Microalb Creat Ratio: 13 mcg/mg creat (ref <30)
Microalb, Ur: 0.4 mg/dL

## 2021-08-31 NOTE — Progress Notes (Signed)
Annual Wellness Visit     Patient: Courtney Crosby, Female    DOB: 1945/12/12, 75 y.o.   MRN: [id number] Visit Date: 08/31/2021  Chief Complaint  Patient presents with   Medicare Wellness   Subjective    Courtney Crosby is a 75 y.o. female who presents today for her Annual Wellness Visit.  HPI she also is here for health maintenance exam and evaluation of medical issues.  She sees endocrinologist for type 2 diabetes mellitus.  Hemoglobin A1c excellent at 6.8%.  Currently taking metformin XR 500 mg twice daily.  Tried Crestor 3 times a week could not tolerate that.  Could not tolerate Zetia.  Takes red yeast rice extract currently per Dr. Letta Median.  History of hypertension treated with lisinopril.  History of obesity and metabolic syndrome.  Has annual diabetic eye exam in [facility] ophthalmology.  Has declined colonoscopy and Cologuard.  Declines flu vaccine.  Has not been vaccinated for COVID-19.  Had Herpes zoster left trunk in 2006.  History of GE reflux    Patient Care Team: Elby Showers, MD as PCP - General (Internal Medicine) Philemon Kingdom, MD as Consulting Physician (Endocrinology)  Review of Systems no new complaints-worried about her husband's health   Objective    Vitals: BP 130/88   Pulse 64   Temp (!) 97.4 F (36.3 C) (Tympanic)   Ht 5' 2.5" (1.588 m)   Wt 153 lb (69.4 kg)   SpO2 98%   BMI 27.54 kg/m   Physical Exam  Skin: Warm and dry.  No cervical adenopathy.  No carotid bruits.  TMs clear.  Chest clear.  Cardiac exam: Regular rate and rhythm without ectopy.  Has systolic ejection murmur breasts are without masses.  Abdomen obese soft nondistended without hepatosplenomegaly masses or tenderness.  No lower extremity pitting edema.  Affect thought and judgment are normal.  Neuro is intact without gross focal deficits.   Most recent functional status assessment: In your present state of health, do you have any difficulty performing the  following activities: 08/31/2021  Hearing? Y  Vision? N  Difficulty concentrating or making decisions? N  Walking or climbing stairs? N  Dressing or bathing? N  Doing errands, shopping? N  Preparing Food and eating ? N  Using the Toilet? N  In the past six months, have you accidently leaked urine? N  Do you have problems with loss of bowel control? N  Managing your Medications? N  Managing your Finances? N  Housekeeping or managing your Housekeeping? N  Some recent data might be hidden   Most recent fall risk assessment: Fall Risk  08/31/2021  Falls in the past year? 0  Number falls in past yr: 0  Injury with Fall? 0  Risk for fall due to : No Fall Risks  Follow up Falls evaluation completed    Most recent depression screenings: PHQ 2/9 Scores 08/31/2021 08/28/2020  PHQ - 2 Score 0 0   Most recent cognitive screening: 6CIT Screen 08/31/2021  What Year? 0 points  What month? 0 points  What time? 0 points  Count back from 20 0 points  Months in reverse 0 points  Repeat phrase 0 points  Total Score 0       Assessment & Plan   Type 2 diabetes mellitus treated currently with just metformin and stable.  Hemoglobin A1c excellent at 6.8%  Mixed hyperlipidemia-does not tolerate Zetia or statins and currently is taking red yeast rice.  Obesity-BMI 27.54  GE reflux  Health maintenance declines colonoscopy and Cologuard.  Declines flu and COVID vaccines  Mild aortic stenosis with a gradient of 8.4 mmHg.  Has mild thickening of aortic valve with moderate calcification of the aortic valve.  Peak gradient 13.3 mmHg.  Suggest repeat study next year.  Consider cardiology consultation.  Remote history of melanoma 1991 without recurrence  Had trace occult blood on urine dipstick but culture grew mixed genital flora.  Suspect this was not a clean-catch specimen.  She is asymptomatic.  Plan: She will follow-up in 1 year or as needed.  Continue current medications.  Declines  vaccines.      Annual wellness visit done today including the all of the following: Reviewed patient's Family Medical History Reviewed and updated list of patient's medical providers Assessment of cognitive impairment was done Assessed patient's functional ability Established a written schedule for health screening [place] Completed and Reviewed  Discussed health benefits of physical activity, and encouraged her to engage in regular exercise appropriate for her age and condition.         IElby Showers, MD, have reviewed all documentation for this visit. The documentation on 10/09/21 for the exam, diagnosis, procedures, and orders are all accurate and complete.   Angus Seller, CMA

## 2021-08-31 NOTE — Patient Instructions (Addendum)
It was a pleasure to see you today. Has declined vaccines. Continue to work on diet and exercise for glucose control. RTC in one year or as needed. Continue follow up with Endocrinology. Urine specimen cultured today but has no UTI symptoms.

## 2021-09-01 LAB — URINE CULTURE
MICRO NUMBER:: 12669533
SPECIMEN QUALITY:: ADEQUATE

## 2021-09-16 ENCOUNTER — Other Ambulatory Visit (HOSPITAL_COMMUNITY): Payer: Self-pay

## 2021-09-16 ENCOUNTER — Other Ambulatory Visit: Payer: Self-pay | Admitting: Internal Medicine

## 2021-09-16 MED ORDER — ONETOUCH DELICA PLUS LANCET30G MISC
99 refills | Status: DC
  Filled 2021-09-16: qty 100, 25d supply, fill #0

## 2021-09-16 MED FILL — Glucose Blood Test Strip: 25 days supply | Qty: 100 | Fill #2 | Status: AC

## 2021-10-20 ENCOUNTER — Other Ambulatory Visit (HOSPITAL_COMMUNITY): Payer: Self-pay

## 2021-12-21 ENCOUNTER — Encounter: Payer: Self-pay | Admitting: Internal Medicine

## 2021-12-22 NOTE — Progress Notes (Signed)
This visit occurred during the SARS-CoV-2 public health emergency.  Safety protocols were in place, including screening questions prior to the visit, additional usage of staff PPE, and extensive cleaning of exam room while observing appropriate contact time as indicated for disinfecting solutions.  ? ?HPI: ?Courtney Crosby is a 76 y.o.-year-old female, returning for follow-up for DM2, dx in ~2015, non-insulin-dependent, now controlled, without long-term complications.  Last visit 4  months ago. ? ?Interim history: ?No increased urination, blurry vision, nausea, chest pain. She has intermittent dizziness.  ? ?Reviewed HbA1c levels: ?Lab Results  ?Component Value Date  ? HGBA1C 6.8 (H) 08/30/2021  ? HGBA1C 6.8 (A) 08/23/2021  ? HGBA1C 6.5 (A) 04/30/2021  ? HGBA1C 6.5 (A) 12/25/2020  ? HGBA1C 6.8 (H) 08/24/2020  ? HGBA1C 6.5 (A) 04/24/2020  ? HGBA1C 6.4 (H) 02/20/2020  ? HGBA1C 6.3 (A) 12/23/2019  ? HGBA1C 6.4 (H) 08/23/2019  ? HGBA1C 6.4 (H) 02/26/2019  ? ?She is on ?- Metformin ER 1000 mg with dinner -started 09/2018 - diarrhea >> we had to reduce the dose >> 500 mg 2x a day ?We stopped Januvia 100 mg before b'fast 2/2 $$$-initially had soft stools with this, then they resolved ?She did not try Metformin >> mother and aunt had CKD while on this. ? ?Pt checks her sugars once a day: ?- am:  115-143, 152, 171 >> 111-146 >> 87, 104-142, 149, 166,  ?- 2h after b'fast: 94-144 >> 130 >> 92 >> 123 >> 127-132 ?- before lunch:  114-131 >> 91-125, 146 >> 60, 97-135 ?- 2h after lunch: 100 >> 114-147, 187 >> n/c   ?- before dinner: 81, 112-147 >> 97-144 >> 106-139 >> 99-131 ?- 2h after dinner: 137, 152 >> 177 >> 109-177 >> n/c >> 144, 146 ?- bedtime: 124-131 >> 107, 113, 172 (no medicines) >> 95, 111 ?- nighttime: 130-140's >> 113 >> 129 >> n/c ?Lowest sugar was 64 >> 79 >> 60 x1; she has hypoglycemia awareness in the 70s. ?Highest sugar was: 210 >> .Marland Kitchen.177 >> 172 >> 166. ? ?Glucometer: One Touch. ? ?Pt's meals are: ?-  Breakfast: Eggo waffle and PB, cinnamon, honey ?- Lunch: Eats out - chick fil-a ?- Dinner: Eats out or spaghetti ?- Snacks: no usually, occas. At night - frozen grapes, chocolate candy, apples, berries ?She used to drink sodas but stopped. ? ?-No CKD, last BUN/creatinine:  ?Lab Results  ?Component Value Date  ? BUN 12 08/30/2021  ? BUN 12 08/24/2020  ? CREATININE 0.69 08/30/2021  ? CREATININE 0.73 08/24/2020  ?Off lisinopril for 2 years. ? ?-+ HL; last set of lipids: ?Lab Results  ?Component Value Date  ? CHOL 215 (H) 08/30/2021  ? CHOL 228 (H) 08/24/2020  ? CHOL 221 (H) 02/20/2020  ? ?Lab Results  ?Component Value Date  ? HDL 47 (L) 08/30/2021  ? HDL 47 (L) 08/24/2020  ? HDL 47 (L) 02/20/2020  ? ?Lab Results  ?Component Value Date  ? LDLCALC 141 (H) 08/30/2021  ? [place] 145 (H) 08/24/2020  ? LDLCALC 134 (H) 02/20/2020  ? ?Lab Results  ?Component Value Date  ? TRIG 147 08/30/2021  ? TRIG 215 (H) 08/24/2020  ? TRIG 245 (H) 02/20/2020  ?Intolerant to statins due to myalgias and muscle weakness. ?Tried Crestor 3 times a week but could not tolerate it. ?She could not tolerate Zetia. ?She tried [employer] x1 mo in 02/2019 >> loose stools and stool incontinence (retrospectively, most likely caused by Metformin)>> stopped.  We restarted  this at 600 mg once a day and increased twice a day. ? ?- last eye exam was in 02/2021: No DR ? ?- no numbness and tingling in her feet. ? ?Pt has FH of DM in mother, MGM, aunts and uncles, cousins. ? ?She has a history of HTN, distant history of melanoma -> 20 years ago, seasonal depression during winter, lichen sclerosis, osteoarthritis of thumbs.  She also has a history of gout. ? ?She is a retired Marine scientist. ? ?ROS: ?+ see HPI ? ?Past Medical History:  ?Diagnosis Date  ? Arthritis   ? Blood pressure elevated   ? Cancer [facility])   ? melanoma  ? Cholesterol serum elevated   ? Diabetes mellitus   ? H/O: cesarean section 1976 and 1978  ? History of appendectomy age 55  ? History of  tonsillectomy 1965  ? Hypercholesteremia 05/18/2011  ? Hyperlipidemia   ? Hypertension   ? Melanoma ([place]) 05/18/2011  ? ?Past Surgical History:  ?Procedure Laterality Date  ? APPENDECTOMY   1958  ? [place]  ? TONSILLECTOMY  1965  ? ?Social History  ? ?Socioeconomic History  ? Marital status: Married  ?  Spouse name: Not on file  ? Number of children: 2  ? Years of education: Not on file  ? Highest education level: Not on file  ?Occupational History  ?  Retired Marine scientist  ?Tobacco Use  ? Smoking status: Never Smoker  ? Smokeless tobacco: Never Used  ?Substance and Sexual Activity  ? Alcohol use: No  ? Drug use: No  ? ?Current Outpatient Medications on File Prior to Visit  ?Medication Sig Dispense Refill  ? blood glucose meter kit and supplies KIT Dispense based on patient and insurance preference. Use up to four times daily as directed. (FOR ICD-9 250.00, 250.01). 1 each 0  ? Clobetasol Prop Emollient Base (CLOBETASOL PROPIONATE E) 0.05 % emollient cream APPLY TO AFFECTED AREA EVERY DAY X 2-3 WEEKS,THEN EVERY OTHER DAY X2-3WEEKS,THEN TWICE PER WEEK 60 g 1  ? Lancets (ONETOUCH DELICA PLUS [id number]) MISC USE UP TO FOUR TIMES DAILY AS DIRECTED 100 each 99  ? Lancets (ONETOUCH DELICA PLUS [id number]) MISC USE UP TO FOUR TIMES DAILY AS DIRECTED 100 each prn  ? metFORMIN (GLUCOPHAGE-XR) 500 MG 24 hr tablet Take 1 tablet (500 mg total) by mouth in the morning and at bedtime. 180 tablet 3  ? naproxen sodium (ANAPROX) 220 MG tablet Take 220 mg by mouth 2 (two) times daily with a meal.    ? Red Yeast Rice Extract (RED YEAST RICE PO) Take by mouth.    ? ?No current facility-administered medications on file prior to visit.  ? ?Allergies  ?Allergen Reactions  ? Codeine   ? Epinephrine Palpitations  ?  Takes hours for reaction of increase pulse and BP.  ? ?Family History  ?Problem Relation Age of Onset  ? Diabetes Mother   ? Heart disease Mother   ? Hypertension Mother   ? Hypertension Brother   ? Diabetes Maternal  Grandmother   ? Heart disease Maternal Grandmother   ? Hypertension Maternal Grandmother   ? Hypertension Maternal Grandfather   ? Hypertension Paternal Grandmother   ? Hypertension Paternal Grandfather   ? ? ?PE: ?BP 130/88 (BP Location: Right Arm, Patient Position: Sitting, Cuff Size: Normal)   Pulse 87   Ht 5' 2.5" (1.588 m)   Wt 152 lb 6.4 oz (69.1 kg)   SpO2 97%   BMI 27.43  kg/m?  ?Wt Readings from Last 3 Encounters:  ?12/23/21 152 lb 6.4 oz (69.1 kg)  ?08/31/21 153 lb (69.4 kg)  ?08/23/21 155 lb (70.3 kg)  ? ?Constitutional: normal weight, in NAD ?Eyes: PERRLA, EOMI, no exophthalmos ?ENT: moist mucous membranes, no thyromegaly, no cervical lymphadenopathy ?Cardiovascular: RRR, No MRG ?Respiratory: CTA B ?Musculoskeletal: no deformities, strength intact in all 4 ?Skin: moist, warm, no rashes ?Neurological: + tremor with outstretched hands, DTR normal in all 4 ?Diabetic Foot Exam - Simple   ?Simple Foot Form ?Diabetic Foot exam was performed with the following findings: Yes 12/23/2021  9:49 AM  ?Visual Inspection ?No deformities, no ulcerations, no other skin breakdown bilaterally: Yes ?Sensation Testing ?Intact to touch and monofilament testing bilaterally: Yes ?Pulse Check ?Posterior Tibialis and Dorsalis pulse intact bilaterally: Yes ?Comments ?  ? ? ?ASSESSMENT: ?1. DM2, non-insulin-dependent, now controlled, without long-term complications, but with hyperglycemia ? ?2. HL ?-she has a history of statin induced myopathy ? ?3.  Overweight ? ?PLAN:  ?1. Patient with longstanding, previously uncontrolled type 2 diabetes, on oral antidiabetic regimen with metformin ER half maximal dose.  We had to decrease the dose in the past due to loose stools.  She was on Januvia in the past but this was stopped due to good control.  At last visit, sugars remained well controlled, without hyperglycemic spikes and without lows.  We did not change her regimen.  HbA1c at that time was 6.8%, slightly higher, but still at  goal. ?-At today's visit, most of her blood sugars are at goal.  She had 1 low blood sugar, at 60, before lunch, 2 months ago, but she did not document in her log and why this happened and she cannot remember.  However, lately,

## 2021-12-23 ENCOUNTER — Ambulatory Visit: Payer: Medicare Other | Admitting: Internal Medicine

## 2021-12-23 ENCOUNTER — Other Ambulatory Visit (HOSPITAL_COMMUNITY): Payer: Self-pay

## 2021-12-23 ENCOUNTER — Other Ambulatory Visit: Payer: Self-pay

## 2021-12-23 ENCOUNTER — Encounter: Payer: Self-pay | Admitting: Internal Medicine

## 2021-12-23 VITALS — BP 130/88 | HR 87 | Ht 62.5 in | Wt 152.4 lb

## 2021-12-23 DIAGNOSIS — E1165 Type 2 diabetes mellitus with hyperglycemia: Secondary | ICD-10-CM | POA: Diagnosis not present

## 2021-12-23 DIAGNOSIS — E7800 Pure hypercholesterolemia, unspecified: Secondary | ICD-10-CM

## 2021-12-23 DIAGNOSIS — E663 Overweight: Secondary | ICD-10-CM

## 2021-12-23 LAB — POCT GLYCOSYLATED HEMOGLOBIN (HGB A1C): Hemoglobin A1C: 6.6 % — AB (ref 4.0–5.6)

## 2021-12-23 MED ORDER — ONETOUCH DELICA PLUS LANCET33G MISC
3 refills | Status: DC
  Filled 2021-12-23: qty 100, 50d supply, fill #0
  Filled 2022-04-08: qty 100, 50d supply, fill #1
  Filled 2022-07-11: qty 100, 50d supply, fill #2
  Filled 2022-11-07: qty 100, 50d supply, fill #3

## 2021-12-23 MED ORDER — METFORMIN HCL ER 500 MG PO TB24
500.0000 mg | ORAL_TABLET | Freq: Two times a day (BID) | ORAL | 3 refills | Status: DC
  Filled 2021-12-23 – 2022-01-25 (×3): qty 180, 90d supply, fill #0
  Filled 2022-04-27: qty 180, 90d supply, fill #1
  Filled 2022-08-01: qty 180, 90d supply, fill #2
  Filled 2022-11-07: qty 180, 90d supply, fill #3

## 2021-12-23 MED ORDER — GLUCOSE BLOOD VI STRP
ORAL_STRIP | 3 refills | Status: DC
  Filled 2021-12-23: qty 100, 50d supply, fill #0
  Filled 2022-04-08: qty 100, 50d supply, fill #1
  Filled 2022-07-11: qty 100, 50d supply, fill #2
  Filled 2022-11-07: qty 100, 50d supply, fill #3

## 2021-12-23 MED ORDER — GLUCOSE BLOOD VI STRP: ORAL_STRIP | 3 refills | Status: AC

## 2021-12-23 NOTE — Patient Instructions (Addendum)
Please continue: - Metformin ER 500 mg 2x a day  Please return in 6 months with your sugar log.  

## 2022-01-25 ENCOUNTER — Other Ambulatory Visit (HOSPITAL_COMMUNITY): Payer: Self-pay

## 2022-02-08 LAB — HM DIABETES EYE EXAM

## 2022-02-14 ENCOUNTER — Encounter: Payer: Self-pay | Admitting: Internal Medicine

## 2022-04-08 ENCOUNTER — Other Ambulatory Visit (HOSPITAL_COMMUNITY): Payer: Self-pay

## 2022-04-27 ENCOUNTER — Other Ambulatory Visit (HOSPITAL_COMMUNITY): Payer: Self-pay

## 2022-05-30 ENCOUNTER — Other Ambulatory Visit: Payer: Self-pay | Admitting: Internal Medicine

## 2022-05-30 DIAGNOSIS — Z1231 Encounter for screening mammogram for malignant neoplasm of breast: Secondary | ICD-10-CM

## 2022-06-07 ENCOUNTER — Ambulatory Visit
Admission: RE | Admit: 2022-06-07 | Discharge: 2022-06-07 | Disposition: A | Discharge status: Home / Self Care | Payer: Medicare Other | Source: Ambulatory Visit | Attending: Internal Medicine | Admitting: Internal Medicine

## 2022-06-07 DIAGNOSIS — Z1231 Encounter for screening mammogram for malignant neoplasm of breast: Secondary | ICD-10-CM

## 2022-06-28 ENCOUNTER — Encounter: Payer: Self-pay | Admitting: Internal Medicine

## 2022-06-28 ENCOUNTER — Ambulatory Visit: Payer: Medicare Other | Admitting: Internal Medicine

## 2022-06-28 VITALS — BP 150/100 | HR 81 | Ht 62.5 in | Wt 152.0 lb

## 2022-06-28 DIAGNOSIS — E1165 Type 2 diabetes mellitus with hyperglycemia: Secondary | ICD-10-CM | POA: Diagnosis not present

## 2022-06-28 DIAGNOSIS — E7800 Pure hypercholesterolemia, unspecified: Secondary | ICD-10-CM | POA: Diagnosis not present

## 2022-06-28 DIAGNOSIS — E663 Overweight: Secondary | ICD-10-CM

## 2022-06-28 LAB — POCT GLYCOSYLATED HEMOGLOBIN (HGB A1C): Hemoglobin A1C: 6.5 % — AB (ref 4.0–5.6)

## 2022-06-28 NOTE — Progress Notes (Signed)
HPI: Courtney Crosby is a 76 y.o.-year-old female, returning for follow-up for DM2, dx in ~2015, non-insulin-dependent, now controlled, without long-term complications.  Last visit 6  months ago.  Interim history: No increased urination, blurry vision, nausea, chest pain.   Reviewed HbA1c levels: Lab Results  Component Value Date   HGBA1C 6.6 (A) 12/23/2021   HGBA1C 6.8 (H) 08/30/2021   HGBA1C 6.8 (A) 08/23/2021   HGBA1C 6.5 (A) 04/30/2021   HGBA1C 6.5 (A) 12/25/2020   HGBA1C 6.8 (H) 08/24/2020   HGBA1C 6.5 (A) 04/24/2020   HGBA1C 6.4 (H) 02/20/2020   HGBA1C 6.3 (A) 12/23/2019   HGBA1C 6.4 (H) 08/23/2019   She is on - Metformin ER 1000 mg with dinner -started 09/2018 - diarrhea >> we had to reduce the dose >> 500 mg 2x a day We stopped Januvia 100 mg before b'fast 2/2 $$$-initially had soft stools with this, then they resolved She did not try Metformin >> mother and aunt had CKD while on this.  Pt checks her sugars once a day: - am:  111-146 >> 87, 104-142, 149, 166 >> 100-131, 149, 164 - 2h after b'fast: 94-144 >> 130 >> 92 >> 123 >> 127-132 >> 115-129 - before lunch:  114-131 >> 91-125, 146 >> 60, 97-135 >> 101-127 - 2h after lunch: 100 >> 114-147, 187 >> n/c   - before dinner: 97-144 >> 106-139 >> 99-131 >> 108, 116, 143 - 2h after dinner: 177 >> 109-177 >> n/c >> 144, 146 >> 138-140 - bedtime: 124-131 >> 107, 113, 172 (no medicines) >> 95, 111 >> 142 - nighttime: 130-140's >> 113 >> 129 >> n/c Lowest sugar was 79 >> 60 x1>> 96 in last 2 mo; she has hypoglycemia awareness in the 70s. Highest sugar was: 210 >> ...>> 166 > 171.  Glucometer: One Touch.  Pt's meals are: - Breakfast: Eggo waffle and PB, cinnamon, honey - Lunch: Eats out - chick fil-a - Dinner: Eats out or spaghetti - Snacks: no usually, occas. At night - frozen grapes, chocolate candy, apples, berries She used to drink sodas but stopped.  -No CKD, last BUN/creatinine:  Lab Results  Component Value  Date   BUN 12 08/30/2021   BUN 12 08/24/2020   CREATININE 0.69 08/30/2021   CREATININE 0.73 08/24/2020  Off lisinopril for 2 years.  -+ HL; last set of lipids: Lab Results  Component Value Date   CHOL 215 (H) 08/30/2021   CHOL 228 (H) 08/24/2020   CHOL 221 (H) 02/20/2020   Lab Results  Component Value Date   HDL 47 (L) 08/30/2021   HDL 47 (L) 08/24/2020   HDL 47 (L) 02/20/2020   Lab Results  Component Value Date   LDLCALC 141 (H) 08/30/2021   LDLCALC 145 (H) 08/24/2020   LDLCALC 134 (H) 02/20/2020   Lab Results  Component Value Date   TRIG 147 08/30/2021   TRIG 215 (H) 08/24/2020   TRIG 245 (H) 02/20/2020  Intolerant to statins due to myalgias and muscle weakness. Tried Crestor 3 times a week but could not tolerate it. She could not tolerate Zetia. She tried [employer] x1 mo in 02/2019 >> loose stools and stool incontinence (retrospectively, most likely caused by Metformin)>> stopped.  We restarted this at 600 mg once a day and increased twice a day.  - last eye exam was on 02/08/2022: No DR  - no numbness and tingling in her feet.  Last foot exam 12/23/2021.  Pt has FH of DM  in mother, MGM, aunts and uncles, cousins.  She has a history of HTN, distant history of melanoma -> 20 years ago, seasonal depression during winter, lichen sclerosis, osteoarthritis of thumbs.  She also has a history of gout.  She is a retired Marine scientist.  ROS: + see HPI  Past Medical History:  Diagnosis Date   Arthritis    Blood pressure elevated    Cancer ([facility])    melanoma   Cholesterol serum elevated    Diabetes mellitus    H/O: cesarean section 1976 and 1978   History of appendectomy age 31   History of tonsillectomy 1965   Hypercholesteremia 05/18/2011   Hyperlipidemia    Hypertension    Melanoma ([place]) 05/18/2011   Past Surgical History:  Procedure Laterality Date   [place] and [place]   Social History   Socioeconomic  History   Marital status: Married    Spouse name: Not on file   Number of children: 2   Years of education: Not on file   Highest education level: Not on file  Occupational History    Retired Marine scientist  Tobacco Use   Smoking status: Never Smoker   Smokeless tobacco: Never Used  Substance and Sexual Activity   Alcohol use: No   Drug use: No   Current Outpatient Medications on File Prior to Visit  Medication Sig Dispense Refill   blood glucose meter kit and supplies KIT Dispense based on patient and insurance preference. Use up to four times daily as directed. (FOR ICD-9 250.00, 250.01). 1 each 0   Clobetasol Prop Emollient Base (CLOBETASOL PROPIONATE E) 0.05 % emollient cream APPLY TO AFFECTED AREA EVERY DAY X 2-3 WEEKS,THEN EVERY OTHER DAY X2-3WEEKS,THEN TWICE PER WEEK 60 g 1   glucose blood test strip Use as instructed 2x a day - On Touch Verio device 200 each 3   glucose blood test strip Use as instructed 2 x a day 200 each 3   Lancets (ONETOUCH DELICA PLUS [id number]) MISC USE UP TO 2 TIMES DAILY AS DIRECTED 200 each 3   metFORMIN (GLUCOPHAGE-XR) 500 MG 24 hr tablet Take 1 tablet (500 mg total) by mouth in the morning and at bedtime. 180 tablet 3   naproxen sodium (ANAPROX) 220 MG tablet Take 220 mg by mouth 2 (two) times daily with a meal.     Red Yeast Rice Extract (RED YEAST RICE PO) Take by mouth.     No current facility-administered medications on file prior to visit.   Allergies  Allergen Reactions   Codeine    Epinephrine Palpitations    Takes hours for reaction of increase pulse and BP.   Family History  Problem Relation Age of Onset   Diabetes Mother    Heart disease Mother    Hypertension Mother    Hypertension Brother    Diabetes Maternal Grandmother    Heart disease Maternal Grandmother    Hypertension Maternal Grandmother    Hypertension Maternal Grandfather    Hypertension Paternal Grandmother    Hypertension Paternal Grandfather     PE: BP (!) 150/100 (BP  Location: Right Arm, Patient Position: Sitting, Cuff Size: Normal)   Pulse 81   Ht 5' 2.5" (1.588 m)   Wt 152 lb (68.9 kg)   SpO2 96%   BMI 27.36 kg/m  Wt Readings from Last 3 Encounters:  06/28/22 152 lb (68.9 kg)  12/23/21 152 lb 6.4 oz (69.1  kg)  08/31/21 153 lb (69.4 kg)   Constitutional: normal weight, in NAD Eyes: EOMI, no exophthalmos ENT: no thyromegaly, no cervical lymphadenopathy Cardiovascular: RRR, No MRG Respiratory: CTA B Musculoskeletal: no deformities Skin: moist, warm, no rashes Neurological: + tremor with outstretched hands  ASSESSMENT: 1. DM2, non-insulin-dependent, now controlled, without long-term complications, but with hyperglycemia  2. HL -she has a history of statin induced myopathy  3.  Overweight  PLAN:  1. Patient with longstanding, previously uncontrolled type 2 diabetes, on oral antidiabetic regimen with metformin only half maximal dose.  We had to decrease the dose in the past due to loose stools.  She was on Januvia in the past but this was stopped due to good control.  At last visit, sugars were mostly at goal, without hyperglycemic spikes and without lows.  HbA1c was 6.6%, improved.  We did not chang her regimen. - at today's visit, sugars are almost all at goal, with only few mild hyperglycemic exceptions.  She would be interested in reducing the dose of metformin, but I would not recommend to do this now, especially with the holidays coming up. - I suggested to:  Patient Instructions  Please continue: - Metformin ER 500 mg 2x a day  Please return in 6 months with your sugar log.   - we checked her HbA1c: 6.5% (slightly lower) - advised to check sugars at different times of the day - 1x a day, rotating check times - advised for yearly eye exams >> she is UTD - return to clinic in 6 months  2. HL -Reviewed her latest lipid panel from 08/2021: LDL above target, HDL slightly low: Lab Results  Component Value Date   CHOL 215 (H)  08/30/2021   HDL 47 (L) 08/30/2021   LDLCALC 141 (H) 08/30/2021   TRIG 147 08/30/2021   CHOLHDL 4.6 08/30/2021  -She is not tolerate statins due to muscle aches.  She tried Crestor 3 times a week and also Zetia but she could not tolerate them.  She refused injectable medications for hyperlipidemia.  We started red yeast rice, currently on 600 mg twice a day with good tolerance.    3.  Overweight -We we will continue metformin which has a mild appetite suppressant effect long-term -Weight was approximately stable at last visit -At today's visit, weight remains stable  Philemon Kingdom, MD PhD [facility] Endocrinology

## 2022-06-28 NOTE — Patient Instructions (Signed)
Please continue: - Metformin ER 500 mg 2x a day  Please return in 6 months with your sugar log.

## 2022-07-11 ENCOUNTER — Other Ambulatory Visit (HOSPITAL_COMMUNITY): Payer: Self-pay

## 2022-08-01 ENCOUNTER — Other Ambulatory Visit (HOSPITAL_COMMUNITY): Payer: Self-pay

## 2022-08-29 ENCOUNTER — Other Ambulatory Visit: Payer: Medicare Other

## 2022-08-29 DIAGNOSIS — E8881 Metabolic syndrome: Secondary | ICD-10-CM

## 2022-08-29 DIAGNOSIS — E7800 Pure hypercholesterolemia, unspecified: Secondary | ICD-10-CM

## 2022-08-29 DIAGNOSIS — R5383 Other fatigue: Secondary | ICD-10-CM

## 2022-08-29 DIAGNOSIS — Z0000 Encounter for general adult medical examination without abnormal findings: Secondary | ICD-10-CM

## 2022-08-29 DIAGNOSIS — I10 Essential (primary) hypertension: Secondary | ICD-10-CM

## 2022-08-29 DIAGNOSIS — E782 Mixed hyperlipidemia: Secondary | ICD-10-CM

## 2022-08-29 DIAGNOSIS — E1165 Type 2 diabetes mellitus with hyperglycemia: Secondary | ICD-10-CM

## 2022-08-30 LAB — CBC WITH DIFFERENTIAL/PLATELET
Absolute Monocytes: 698 cells/uL (ref 200–950)
Basophils Absolute: 77 cells/uL (ref 0–200)
Basophils Relative: 1.2 %
Eosinophils Absolute: 0 cells/uL — ABNORMAL LOW (ref 15–500)
Eosinophils Relative: 0 %
HCT: 47 % — ABNORMAL HIGH (ref 35.0–45.0)
Hemoglobin: 16.1 g/dL — ABNORMAL HIGH (ref 11.7–15.5)
Lymphs Abs: 2067 cells/uL (ref 850–3900)
MCH: 30.1 pg (ref 27.0–33.0)
MCHC: 34.3 g/dL (ref 32.0–36.0)
MCV: 87.9 fL (ref 80.0–100.0)
MPV: 11.6 fL (ref 7.5–12.5)
Monocytes Relative: 10.9 %
Neutro Abs: 3558 cells/uL (ref 1500–7800)
Neutrophils Relative %: 55.6 %
Platelets: 281 10*3/uL (ref 140–400)
RBC: 5.35 10*6/uL — ABNORMAL HIGH (ref 3.80–5.10)
RDW: 12.7 % (ref 11.0–15.0)
Total Lymphocyte: 32.3 %
WBC: 6.4 10*3/uL (ref 3.8–10.8)

## 2022-08-30 LAB — COMPLETE METABOLIC PANEL WITH GFR
AG Ratio: 1.6 (calc) (ref 1.0–2.5)
ALT: 15 U/L (ref 6–29)
AST: 18 U/L (ref 10–35)
Albumin: 4.4 g/dL (ref 3.6–5.1)
Alkaline phosphatase (APISO): 56 U/L (ref 37–153)
BUN: 10 mg/dL (ref 7–25)
CO2: 28 mmol/L (ref 20–32)
Calcium: 10.1 mg/dL (ref 8.6–10.4)
Chloride: 103 mmol/L (ref 98–110)
Creat: 0.67 mg/dL (ref 0.60–1.00)
Globulin: 2.8 g/dL (calc) (ref 1.9–3.7)
Glucose, Bld: 131 mg/dL — ABNORMAL HIGH (ref 65–99)
Potassium: 5.2 mmol/L (ref 3.5–5.3)
Sodium: 142 mmol/L (ref 135–146)
Total Bilirubin: 0.6 mg/dL (ref 0.2–1.2)
Total Protein: 7.2 g/dL (ref 6.1–8.1)
eGFR: 89 mL/min/{1.73_m2} (ref >=60)

## 2022-08-30 LAB — LIPID PANEL
Cholesterol: 242 mg/dL — ABNORMAL HIGH (ref <200)
HDL: 52 mg/dL (ref >=50)
LDL Cholesterol (Calc): 157 mg/dL (calc) — ABNORMAL HIGH
Non-HDL Cholesterol (Calc): 190 mg/dL (calc) — ABNORMAL HIGH (ref <130)
Total CHOL/HDL Ratio: 4.7 (calc) (ref <5.0)
Triglycerides: 179 mg/dL — ABNORMAL HIGH (ref <150)

## 2022-08-30 LAB — HEMOGLOBIN A1C
Hgb A1c MFr Bld: 6.9 % of total Hgb — ABNORMAL HIGH (ref <5.7)
Mean Plasma Glucose: 151 mg/dL
eAG (mmol/L): 8.4 mmol/L

## 2022-08-30 LAB — TSH: TSH: 1.87 mIU/L (ref 0.40–4.50)

## 2022-09-05 ENCOUNTER — Ambulatory Visit (INDEPENDENT_AMBULATORY_CARE_PROVIDER_SITE_OTHER): Payer: Medicare Other | Admitting: Internal Medicine

## 2022-09-05 ENCOUNTER — Encounter: Payer: Self-pay | Admitting: Internal Medicine

## 2022-09-05 VITALS — BP 108/74 | HR 87 | Temp 98.2°F | Ht 62.75 in | Wt 150.4 lb

## 2022-09-05 DIAGNOSIS — I350 Nonrheumatic aortic (valve) stenosis: Secondary | ICD-10-CM | POA: Diagnosis not present

## 2022-09-05 DIAGNOSIS — E782 Mixed hyperlipidemia: Secondary | ICD-10-CM

## 2022-09-05 DIAGNOSIS — R748 Abnormal levels of other serum enzymes: Secondary | ICD-10-CM

## 2022-09-05 DIAGNOSIS — Z0000 Encounter for general adult medical examination without abnormal findings: Secondary | ICD-10-CM

## 2022-09-05 DIAGNOSIS — Z85820 Personal history of malignant melanoma of skin: Secondary | ICD-10-CM

## 2022-09-05 DIAGNOSIS — E1169 Type 2 diabetes mellitus with other specified complication: Secondary | ICD-10-CM | POA: Diagnosis not present

## 2022-09-05 DIAGNOSIS — I10 Essential (primary) hypertension: Secondary | ICD-10-CM | POA: Diagnosis not present

## 2022-09-05 DIAGNOSIS — R82998 Other abnormal findings in urine: Secondary | ICD-10-CM

## 2022-09-05 DIAGNOSIS — K580 Irritable bowel syndrome with diarrhea: Secondary | ICD-10-CM

## 2022-09-05 DIAGNOSIS — E785 Hyperlipidemia, unspecified: Secondary | ICD-10-CM

## 2022-09-05 DIAGNOSIS — F439 Reaction to severe stress, unspecified: Secondary | ICD-10-CM

## 2022-09-05 DIAGNOSIS — E1165 Type 2 diabetes mellitus with hyperglycemia: Secondary | ICD-10-CM

## 2022-09-05 DIAGNOSIS — Z8719 Personal history of other diseases of the digestive system: Secondary | ICD-10-CM

## 2022-09-05 LAB — POCT URINALYSIS DIPSTICK
Bilirubin, UA: NEGATIVE
Blood, UA: NEGATIVE
Glucose, UA: NEGATIVE
Ketones, UA: NEGATIVE
Nitrite, UA: NEGATIVE
Protein, UA: NEGATIVE
Spec Grav, UA: 1.01 (ref 1.010–1.025)
Urobilinogen, UA: 0.2 E.U./dL
pH, UA: 6.5 (ref 5.0–8.0)

## 2022-09-05 NOTE — Patient Instructions (Addendum)
It was a pleasure to see you today.  Tetanus immunization up-to-date discussed.  Will be needed if she injures herself.  Declines flu vaccine.  Declines Cologuard.  Has mixed hyperlipidemia.  Needs to watch diet and exercise.  Creatinine and liver functions are normal.  Follow-up in 1 year or as needed.  Is statin intolerant.

## 2022-09-05 NOTE — Progress Notes (Signed)
Annual Wellness Visit     Patient: Courtney Crosby, Female    DOB: [birth year], 76 y.o.   MRN: [id number] Visit Date: 09/05/2022   Subjective      HPI Here for Medicare wellness and health maintenance exam.  History of type 2 diabetes mellitus.  Currently on metformin.  Says she has not been adhering to diabetic diet. Says she is a stress eater.  Sees Dr. Cruzita Lederer for diabetes.  She tried Crestor 3 times a week for hyperlipidemia.  Could not tolerate that.  Could not tolerate Zetia either.  Takes red yeast rice.  History of obesity and metabolic syndrome.  Has annual diabetic eye exam at [facility].  Declines colonoscopy and Cologuard.  Had Herpes zoster left trunk in 2006.  History of GE reflux.  Remote history of melanoma 1991 with no recurrence.  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  Mild aortic stenosis with gradient 8.4 mmHg in 2021.  No chest pain or shortness of breath.  Past medical history: Fractured right humerus 2007.  History of appendectomy.  History of ingrown toenail surgery bilaterally.  C-sections in 1976 and 1978.  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  Husband is retired Higher education careers adviser and has developed dementia.  Non-smoker.  Does not consume alcohol.  Family history: Mother with history of diabetes and hypertension.  Declines bone density study.  Says she will not take bone sparing medication even if she is diagnosed with osteoporosis.    Patient Care Team: Elby Showers, MD as PCP - General (Internal Medicine) Philemon Kingdom, MD as Consulting Physician (Endocrinology)     Objective  Blood pressure 108/74, pulse 87, temperature 98.2 degrees, pulse oximetry 97% BMI 26.85 weight 150 pounds 6.4 ounces height 5 feet 2.75 inches  Skin: Warm and dry.  No cervical adenopathy.  Chest clear.  Cardiac exam: Regular rate and rhythm.  2/6 systolic  ejection murmur breasts are without masses.  Abdomen soft nondistended without hepatosplenomegaly masses or tenderness.  Pap smear deferred due to age.  Bimanual exam deferred.  No lower extremity pitting edema.  Neuro exam is without gross focal deficits.  Affect thought and judgment are normal.          Most recent fall risk assessment:    09/05/2022   10:12 AM  Fall Risk   Falls in the past year? 0  Number falls in past yr: 0  Risk for fall due to : No Fall Risks  Follow up Falls evaluation completed    Most recent depression screenings:    09/05/2022   10:12 AM 08/31/2021   10:01 AM  PHQ 2/9 Scores  PHQ - 2 Score 0 0   Most recent cognitive screening:    08/31/2021   10:02 AM  6CIT Screen  What Year? 0 points  What month? 0 points  What time? 0 points  Count back from 20 0 points  Months in reverse 0 points  Repeat phrase 0 points  Total Score 0 points       Assessment & Plan   Type 2 diabetes mellitus-currently stable and followed by Endocrinology.  Has annual eye exam at [facility]  Situational stress with husband who has memory loss  Essential hypertension-stable on current regimen  History of irritable bowel syndrome-offered Bentyl but she declines  Mixed hyperlipidemia-does not tolerate Zetia or statins and currently is taking red yeast rice  GE  reflux treated with PPI  BMI 26.85  Mild aortic stenosis-denies chest pain or shortness of breath  Remote history of melanoma in 1991 without recurrence  Plan: Immunizations discussed and she declined.  Declines tetanus immunization update.  Return as needed.  Declines flu vaccine.  Declines Cologuard.  Return in 1 year or as needed.  She has mixed hyperlipidemia.  Needs to work on diet and exercise.  Creatinine is normal.  Liver functions are normal.     Return in 1 year or as needed.  Urine culture obtained but she is asymptomatic and may not have given a clean-catch.     Annual  wellness visit done today including the all of the following: Reviewed patient's Family Medical History Reviewed and updated list of patient's medical providers Assessment of cognitive impairment was done Assessed patient's functional ability Established a written schedule for health screening [place] Completed and Reviewed  Discussed health benefits of physical activity, and encouraged her to engage in regular exercise appropriate for her age and condition.    IElby Showers, MD, have reviewed all documentation for this visit. The documentation on 09/05/22 for the exam, diagnosis, procedures, and orders are all accurate and complete.     {I, Elby Showers, MD, have reviewed all documentation for this visit. The documentation on 09/05/22 for the exam, diagnosis, procedures, and orders are all accurate and complete.   LaVon Barron Alvine, CMA

## 2022-09-06 LAB — MICROALBUMIN / CREATININE URINE RATIO
Creatinine, Urine: 83 mg/dL (ref 20–275)
Microalb Creat Ratio: 19 mcg/mg creat (ref <30)
Microalb, Ur: 1.6 mg/dL

## 2022-11-07 ENCOUNTER — Other Ambulatory Visit (HOSPITAL_COMMUNITY): Payer: Self-pay

## 2022-12-29 ENCOUNTER — Ambulatory Visit: Payer: Medicare Other | Admitting: Internal Medicine

## 2022-12-29 ENCOUNTER — Encounter: Payer: Self-pay | Admitting: Internal Medicine

## 2022-12-29 ENCOUNTER — Other Ambulatory Visit (HOSPITAL_COMMUNITY): Payer: Self-pay

## 2022-12-29 VITALS — BP 132/86 | HR 84 | Ht 62.75 in | Wt 148.8 lb

## 2022-12-29 DIAGNOSIS — E663 Overweight: Secondary | ICD-10-CM | POA: Diagnosis not present

## 2022-12-29 DIAGNOSIS — E7800 Pure hypercholesterolemia, unspecified: Secondary | ICD-10-CM | POA: Diagnosis not present

## 2022-12-29 DIAGNOSIS — E1165 Type 2 diabetes mellitus with hyperglycemia: Secondary | ICD-10-CM | POA: Diagnosis not present

## 2022-12-29 LAB — POCT GLYCOSYLATED HEMOGLOBIN (HGB A1C): Hemoglobin A1C: 6.5 % — AB (ref 4.0–5.6)

## 2022-12-29 MED ORDER — METFORMIN HCL ER 500 MG PO TB24
500.0000 mg | ORAL_TABLET | Freq: Two times a day (BID) | ORAL | 3 refills | Status: DC
  Filled 2022-12-29 – 2023-02-08 (×2): qty 180, 90d supply, fill #0
  Filled 2023-05-15 (×2): qty 180, 90d supply, fill #1
  Filled 2023-08-17: qty 180, 90d supply, fill #2
  Filled 2023-11-21: qty 180, 90d supply, fill #3

## 2022-12-29 NOTE — Progress Notes (Signed)
HPI: Courtney Crosby is a 77 y.o.-year-old female, returning for follow-up for DM2, dx in ~2015, non-insulin-dependent, now controlled, without long-term complications.  Last visit 6  months ago.  Interim history: No increased urination, blurry vision, nausea, chest pain.  She tells me she occasionally has a sensation of "floating around"; no clear dizziness.  Reviewed HbA1c levels: Lab Results  Component Value Date   HGBA1C 6.9 (H) 08/29/2022   HGBA1C 6.5 (A) 06/28/2022   HGBA1C 6.6 (A) 12/23/2021   HGBA1C 6.8 (H) 08/30/2021   HGBA1C 6.8 (A) 08/23/2021   HGBA1C 6.5 (A) 04/30/2021   HGBA1C 6.5 (A) 12/25/2020   HGBA1C 6.8 (H) 08/24/2020   HGBA1C 6.5 (A) 04/24/2020   HGBA1C 6.4 (H) 02/20/2020   She is on - Metformin ER 1000 mg with dinner -started 09/2018 - diarrhea >> we had to reduce the dose >> 500 mg 2x a day We stopped Januvia 100 mg before b'fast 2/2 $$$-initially had soft stools with this, then they resolved She did not try Metformin >> mother and aunt had CKD while on this.  Pt checks her sugars once a day: - am:  111-146 >> 87, 104-142, 149, 166 >> 100-131, 149, 164 >> 114-147 - 2h after b'fast: 94-144 >> 130 >> 92 >> 123 >> 127-132 >> 115-129 >> 107 - before lunch:  91-125, 146 >> 60, 97-135 >> 101-127 >> 103-134, 147 - 2h after lunch: 100 >> 114-147, 187 >> n/c   >> 102 - before dinner: 97-144 >> 106-139 >> 99-131 >> 108, 116, 143 >> 105-144 - 2h after dinner: 177 >> 109-177 >> n/c >> 144, 146 >> 138-140 >> 123-133 - bedtime: 124-131 >> 107, 113, 172 (no medicines) >> 95, 111 >> 142 >> 111-176 - nighttime: 130-140's >> 113 >> 129 >> n/c Lowest sugar was 79 >> 60 x1>> 96 >> 89; she has hypoglycemia awareness in the 70s. Highest sugar was: 210 >> ...>> 166 >>  171>> 206.  Glucometer: One Touch.  Pt's meals are: - Breakfast: Eggo waffle and PB, cinnamon, honey - Lunch: Eats out - chick fil-a - Dinner: Eats out or spaghetti - Snacks: no usually, occas. At night -  frozen grapes, chocolate candy, apples, berries She used to drink sodas but stopped.  -No CKD, last BUN/creatinine:  Lab Results  Component Value Date   BUN 10 08/29/2022   BUN 12 08/30/2021   CREATININE 0.67 08/29/2022   CREATININE 0.69 08/30/2021  Off lisinopril for 2 years.  -+ HL; last set of lipids: Lab Results  Component Value Date   CHOL 242 (H) 08/29/2022   CHOL 215 (H) 08/30/2021   CHOL 228 (H) 08/24/2020   Lab Results  Component Value Date   HDL 52 08/29/2022   HDL 47 (L) 08/30/2021   HDL 47 (L) 08/24/2020   Lab Results  Component Value Date   LDLCALC 157 (H) 08/29/2022   LDLCALC 141 (H) 08/30/2021   LDLCALC 145 (H) 08/24/2020   Lab Results  Component Value Date   TRIG 179 (H) 08/29/2022   TRIG 147 08/30/2021   TRIG 215 (H) 08/24/2020  Intolerant to statins due to myalgias and muscle weakness. Tried Crestor 3 times a week but could not tolerate it. She could not tolerate Zetia. She tried [employer] x1 mo in 02/2019 >> loose stools and stool incontinence (retrospectively, most likely caused by Metformin)>> stopped.  We restarted this at 600 mg once a day and increased twice a day.  - last eye  exam was on 02/08/2022: No DR  - no numbness and tingling in her feet.  Last foot exam 09/05/2022.  Pt has FH of DM in mother, MGM, aunts and uncles, cousins.  She has a history of HTN, distant history of melanoma -> 20 years ago, seasonal depression during winter, lichen sclerosis, osteoarthritis of thumbs.  She also has a history of gout.  She is a retired Marine scientist.  ROS: + see HPI  Past Medical History:  Diagnosis Date   Arthritis    Blood pressure elevated    Cancer (HCC)    melanoma   Cholesterol serum elevated    Diabetes mellitus    H/O: cesarean section 1976 and 1978   History of appendectomy age 66   History of tonsillectomy 1965   Hypercholesteremia 05/18/2011   Hyperlipidemia    Hypertension    Melanoma ([place]) 05/18/2011   Past Surgical History:   Procedure Laterality Date   [place] and [place]   Social History   Socioeconomic History   Marital status: Married    Spouse name: Not on file   Number of children: 2   Years of education: Not on file   Highest education level: Not on file  Occupational History    Retired Marine scientist  Tobacco Use   Smoking status: Never Smoker   Smokeless tobacco: Never Used  Substance and Sexual Activity   Alcohol use: No   Drug use: No   Current Outpatient Medications on File Prior to Visit  Medication Sig Dispense Refill   blood glucose meter kit and supplies KIT Dispense based on patient and insurance preference. Use up to four times daily as directed. (FOR ICD-9 250.00, 250.01). 1 each 0   Clobetasol Prop Emollient Base (CLOBETASOL PROPIONATE E) 0.05 % emollient cream APPLY TO AFFECTED AREA EVERY DAY X 2-3 WEEKS,THEN EVERY OTHER DAY X2-3WEEKS,THEN TWICE PER WEEK 60 g 1   glucose blood test strip Use as instructed 2x a day - On Touch Verio device 200 each 3   glucose blood test strip Use as instructed 2 x a day 200 each 3   Lancets (ONETOUCH DELICA PLUS 123XX123) MISC USE UP TO 2 TIMES DAILY AS DIRECTED 200 each 3   metFORMIN (GLUCOPHAGE-XR) 500 MG 24 hr tablet Take 1 tablet (500 mg total) by mouth in the morning and at bedtime. 180 tablet 3   naproxen sodium (ANAPROX) 220 MG tablet Take 220 mg by mouth 2 (two) times daily with a meal.     Red Yeast Rice Extract (RED YEAST RICE PO) Take by mouth.     No current facility-administered medications on file prior to visit.   Allergies  Allergen Reactions   Codeine    Epinephrine Palpitations    Takes hours for reaction of increase pulse and BP.   Family History  Problem Relation Age of Onset   Diabetes Mother    Heart disease Mother    Hypertension Mother    Hypertension Brother    Diabetes Maternal Grandmother    Heart disease Maternal Grandmother    Hypertension Maternal  Grandmother    Hypertension Maternal Grandfather    Hypertension Paternal Grandmother    Hypertension Paternal Grandfather     PE: BP 132/86 (BP Location: Left Arm, Patient Position: Sitting, Cuff Size: Normal)   Pulse 84   Ht 5' 2.75" (1.594 m)   Wt 148 lb 12.8 oz (67.5 kg)  SpO2 96%   BMI 26.57 kg/m  Wt Readings from Last 3 Encounters:  12/29/22 148 lb 12.8 oz (67.5 kg)  09/05/22 150 lb 6.4 oz (68.2 kg)  06/28/22 152 lb (68.9 kg)   Constitutional: normal weight, in NAD Eyes: EOMI, no exophthalmos ENT: no thyromegaly, no cervical lymphadenopathy Cardiovascular: RRR, No MRG Respiratory: CTA B Musculoskeletal: no deformities Skin: no rashes Neurological: + tremor with outstretched hands  ASSESSMENT: 1. DM2, non-insulin-dependent, now controlled, without long-term complications, but with hyperglycemia  2. HL -she has a history of statin induced myopathy  3.  Overweight  PLAN:  1. Patient with longstanding previously uncontrolled type 2 diabetes, on metformin only.  At last visit, her HbA1c was slightly lower, at 6.5% but she had another HbA1c obtained 4 months ago which was higher, at 6.9%. -She was previously on Januvia but this was stopped due to good control.  At last visit, sugars were almost all at goal with only few mild hyperglycemic exceptions.  She was interested in reducing the dose of metformin, which I recommended against especially with the holidays coming up. -At today's visit, sugars appear to be slightly above target in the morning but they are mostly at goal later in the day.  We discussed about moving dinners earlier and increasing activity/exercise, but I do not absolutely feel we need to change her regimen for now. -I also advised her to stay well-hydrated to avoid dizziness/disequilibrium. - I suggested to:  Patient Instructions  Please continue: - Metformin ER 500 mg 2x a day  Please return in 6 months with your sugar log.   - we checked her HbA1c:  6.5% (lower) - advised to check sugars at different times of the day - 1x a day, rotating check times - advised for yearly eye exams >> she is UTD - return to clinic in 6 months  2. HL -Reviewed lipid panel from 08/2022: LDL much above target, triglycerides also high: Lab Results  Component Value Date   CHOL 242 (H) 08/29/2022   HDL 52 08/29/2022   LDLCALC 157 (H) 08/29/2022   TRIG 179 (H) 08/29/2022   CHOLHDL 4.7 08/29/2022  -She is not tolerate statins due to muscle aches.  She tried Crestor 3 times a week and also Zetia but she could not tolerate them.  She refused injectable medications for hyperlipidemia.  She is taking red yeast rice 600 mg twice a day with good tolerance. I recommended the "portfolio diet".  3.  Overweight -Will continue metformin which has a mild appetite suppressant effect long-term -At last visit, weight remained stable; now she is 2 pounds  Philemon Kingdom, MD PhD [facility] Endocrinology

## 2022-12-29 NOTE — Patient Instructions (Addendum)
Please continue: - Metformin ER 500 mg 2x a day  Look up the "portfolio diet".  Please return in 6 months with your sugar log.

## 2023-02-08 ENCOUNTER — Other Ambulatory Visit: Payer: Self-pay | Admitting: Internal Medicine

## 2023-02-08 ENCOUNTER — Other Ambulatory Visit (HOSPITAL_COMMUNITY): Payer: Self-pay

## 2023-02-08 MED ORDER — ONETOUCH DELICA PLUS LANCET33G MISC
Freq: Two times a day (BID) | 3 refills | Status: DC
  Filled 2023-02-08: qty 100, 50d supply, fill #0
  Filled 2023-06-16: qty 100, 50d supply, fill #1
  Filled 2023-11-14 (×2): qty 100, 50d supply, fill #2

## 2023-02-08 MED FILL — Glucose Blood Test Strip: 50 days supply | Qty: 100 | Fill #0 | Status: AC

## 2023-02-21 ENCOUNTER — Encounter: Payer: Self-pay | Admitting: Internal Medicine

## 2023-02-21 LAB — HM DIABETES EYE EXAM

## 2023-05-12 ENCOUNTER — Other Ambulatory Visit: Payer: Self-pay | Admitting: Internal Medicine

## 2023-05-12 DIAGNOSIS — Z1231 Encounter for screening mammogram for malignant neoplasm of breast: Secondary | ICD-10-CM

## 2023-05-15 ENCOUNTER — Other Ambulatory Visit: Payer: Self-pay

## 2023-05-15 ENCOUNTER — Other Ambulatory Visit (HOSPITAL_COMMUNITY): Payer: Self-pay

## 2023-06-09 ENCOUNTER — Ambulatory Visit
Admission: RE | Admit: 2023-06-09 | Discharge: 2023-06-09 | Disposition: A | Discharge status: Home / Self Care | Payer: Medicare Other | Source: Ambulatory Visit | Attending: Internal Medicine | Admitting: Internal Medicine

## 2023-06-09 DIAGNOSIS — Z1231 Encounter for screening mammogram for malignant neoplasm of breast: Secondary | ICD-10-CM

## 2023-06-16 ENCOUNTER — Other Ambulatory Visit (HOSPITAL_COMMUNITY): Payer: Self-pay

## 2023-06-16 MED FILL — Glucose Blood Test Strip: 50 days supply | Qty: 100 | Fill #1 | Status: AC

## 2023-06-19 ENCOUNTER — Telehealth: Payer: Self-pay | Admitting: Internal Medicine

## 2023-06-19 MED ORDER — CLOBETASOL PROPIONATE E 0.05 % EX CREA: TOPICAL_CREAM | CUTANEOUS | 1 refills | Status: AC

## 2023-06-19 NOTE — Telephone Encounter (Signed)
Patient called and said she needs a refill on Clobetasol Prop Emollient Base (CLOBETASOL PROPIONATE E) 0.05 % emollient cream sent to [id number] - [place], [place] - [address].  She said this is a medication she only gets once a year

## 2023-07-05 ENCOUNTER — Ambulatory Visit: Payer: Medicare Other | Admitting: Internal Medicine

## 2023-07-05 ENCOUNTER — Encounter: Payer: Self-pay | Admitting: Internal Medicine

## 2023-07-05 VITALS — BP 120/70 | HR 86 | Resp 18 | Ht 62.75 in | Wt 150.2 lb

## 2023-07-05 DIAGNOSIS — Z7984 Long term (current) use of oral hypoglycemic drugs: Secondary | ICD-10-CM

## 2023-07-05 DIAGNOSIS — E7800 Pure hypercholesterolemia, unspecified: Secondary | ICD-10-CM | POA: Diagnosis not present

## 2023-07-05 DIAGNOSIS — E663 Overweight: Secondary | ICD-10-CM | POA: Diagnosis not present

## 2023-07-05 DIAGNOSIS — E1165 Type 2 diabetes mellitus with hyperglycemia: Secondary | ICD-10-CM | POA: Diagnosis not present

## 2023-07-05 LAB — POCT GLYCOSYLATED HEMOGLOBIN (HGB A1C): Hemoglobin A1C: 6.5 % — AB (ref 4.0–5.6)

## 2023-07-05 NOTE — Addendum Note (Signed)
Addended by: Tera Partridge on: 07/05/2023 11:31 AM   Modules accepted: Orders

## 2023-07-05 NOTE — Progress Notes (Signed)
HPI: Courtney Crosby is a 77 y.o.-year-old female, returning for follow-up for DM2, dx in ~2015, non-insulin-dependent, now controlled, without long-term complications.  Last visit 6  months ago.  Interim history: No increased urination, blurry vision, nausea, chest pain.  She is stressed about her husband who continues to have worsening Alzheimer's dementia.  Reviewed HbA1c levels: Lab Results  Component Value Date   HGBA1C 6.5 (A) 12/29/2022   HGBA1C 6.9 (H) 08/29/2022   HGBA1C 6.5 (A) 06/28/2022   HGBA1C 6.6 (A) 12/23/2021   HGBA1C 6.8 (H) 08/30/2021   HGBA1C 6.8 (A) 08/23/2021   HGBA1C 6.5 (A) 04/30/2021   HGBA1C 6.5 (A) 12/25/2020   HGBA1C 6.8 (H) 08/24/2020   HGBA1C 6.5 (A) 04/24/2020   She is on - Metformin ER 1000 mg with dinner -started 09/2018 - diarrhea >> we had to reduce the dose >> 500 mg 2x a day We stopped Januvia 100 mg before b'fast 2/2 $$$-initially had soft stools with this, then they resolved She did not try Metformin >> mother and aunt had CKD while on this.  Pt checks her sugars once a day: - am: 87, 104-142, 149, 166 >> 100-131, 149, 164 >> 114-147 >> 116-138 - 2h after b'fast: 92 >> 123 >> 127-132 >> 115-129 >> 107 >> 129, 146 - before lunch:  60, 97-135 >> 101-127 >> 103-134, 147 >> 109-147 (missed med) - 2h after lunch: 100 >> 114-147, 187 >> n/c   >> 102 >> 109, 137 - before dinner: 99-131 >> 108, 116, 143 >> 105-144 >> 101-144 - 2h after dinner: 144, 146 >> 138-140 >> 123-133 >> 135  - bedtime: 107, 113, 172 >> 95, 111 >> 142 >> 111-176 >> 108-141, 161 - nighttime: 130-140's >> 113 >> 129 >> n/c Lowest sugar was 60 x1>> 96 >> 89 >> 101; she has hypoglycemia awareness in the 70s. Highest sugar was: 210 >> ...  206 >> 161.  Glucometer: One Touch.  Pt's meals are: - Breakfast: Eggo waffle and PB, cinnamon, honey - Lunch: Eats out - chick fil-a - Dinner: Eats out or spaghetti - Snacks: no usually, occas. At night - frozen grapes, chocolate  candy, apples, berries She used to drink sodas but stopped.  -No CKD, last BUN/creatinine:  Lab Results  Component Value Date   BUN 10 08/29/2022   BUN 12 08/30/2021   CREATININE 0.67 08/29/2022   CREATININE 0.69 08/30/2021   Lab Results  Component Value Date   MICRALBCREAT 19 09/05/2022   MICRALBCREAT 13 08/30/2021   MICRALBCREAT 15 08/24/2020   MICRALBCREAT 6 08/20/2018   MICRALBCREAT 3 08/07/2017   MICRALBCREAT 6 01/31/2017   Off lisinopril for 2 years.  -+ HL; last set of lipids: Lab Results  Component Value Date   CHOL 242 (H) 08/29/2022   CHOL 215 (H) 08/30/2021   CHOL 228 (H) 08/24/2020   Lab Results  Component Value Date   HDL 52 08/29/2022   HDL 47 (L) 08/30/2021   HDL 47 (L) 08/24/2020   Lab Results  Component Value Date   LDLCALC 157 (H) 08/29/2022   LDLCALC 141 (H) 08/30/2021   LDLCALC 145 (H) 08/24/2020   Lab Results  Component Value Date   TRIG 179 (H) 08/29/2022   TRIG 147 08/30/2021   TRIG 215 (H) 08/24/2020  Intolerant to statins due to myalgias and muscle weakness. Tried Crestor 3 times a week but could not tolerate it. She could not tolerate Zetia. She tried [employer] x1 mo in  02/2019 >> loose stools and stool incontinence (retrospectively, most likely caused by Metformin)>> stopped.  We restarted this at 600 mg once a day and increased twice a day.  - last eye exam was on 02/21/2023: No DR  - no numbness and tingling in her feet.  Last foot exam 09/05/2022.  Pt has FH of DM in mother, MGM, aunts and uncles, cousins.  She has a history of HTN, distant history of melanoma -> 20 years ago, seasonal depression during winter, lichen sclerosis, osteoarthritis of thumbs.  She also has a history of gout.  She is a retired Engineer, civil (consulting).  ROS: + see HPI  Past Medical History:  Diagnosis Date   Arthritis    Blood pressure elevated    Cancer (HCC)    melanoma   Cholesterol serum elevated    Diabetes mellitus    H/O: cesarean section 1976  and 1978   History of appendectomy age 78   History of tonsillectomy 1965   Hypercholesteremia 05/18/2011   Hyperlipidemia    Hypertension    Melanoma (HCC) 05/18/2011   Past Surgical History:  Procedure Laterality Date   APPENDECTOMY   1958   CESAREAN SECTION  1976 and 1978   TONSILLECTOMY  1965   Social History   Socioeconomic History   Marital status: Married    Spouse name: Not on file   Number of children: 2   Years of education: Not on file   Highest education level: Not on file  Occupational History    Retired Engineer, civil (consulting)  Tobacco Use   Smoking status: Never Smoker   Smokeless tobacco: Never Used  Substance and Sexual Activity   Alcohol use: No   Drug use: No   Current Outpatient Medications on File Prior to Visit  Medication Sig Dispense Refill   blood glucose meter kit and supplies KIT Dispense based on patient and insurance preference. Use up to four times daily as directed. (FOR ICD-9 250.00, 250.01). 1 each 0   Clobetasol Prop Emollient Base (CLOBETASOL PROPIONATE E) 0.05 % emollient cream APPLY TO AFFECTED AREA EVERY DAY X 2-3 WEEKS,THEN EVERY OTHER DAY X2-3WEEKS,THEN TWICE PER WEEK 60 g 1   glucose blood test strip Use as instructed 2x a day - On Touch Verio device 200 each 3   Lancets (ONETOUCH DELICA PLUS LANCET33G) MISC Use up to 2 (two) times daily as directed 200 each 3   metFORMIN (GLUCOPHAGE-XR) 500 MG 24 hr tablet Take 1 tablet (500 mg total) by mouth in the morning and at bedtime. 180 tablet 3   naproxen sodium (ANAPROX) 220 MG tablet Take 220 mg by mouth 2 (two) times daily with a meal.     glucose blood (ONETOUCH ULTRA TEST) test strip Use 2 (two) times daily as directed 200 each 3   Red Yeast Rice Extract (RED YEAST RICE PO) Take by mouth.     No current facility-administered medications on file prior to visit.   Allergies  Allergen Reactions   Codeine    Epinephrine Palpitations    Takes hours for reaction of increase pulse and BP.   Family History   Problem Relation Age of Onset   Diabetes Mother    Heart disease Mother    Hypertension Mother    Hypertension Brother    Diabetes Maternal Grandmother    Heart disease Maternal Grandmother    Hypertension Maternal Grandmother    Hypertension Maternal Grandfather    Hypertension Paternal Grandmother    Hypertension Paternal Actor  PE: BP 120/70 (BP Location: Left Arm, Patient Position: Sitting, Cuff Size: Normal)   Pulse 86   Resp 18   Ht 5' 2.75" (1.594 m)   Wt 150 lb 3.2 oz (68.1 kg)   SpO2 98%   BMI 26.82 kg/m  Wt Readings from Last 3 Encounters:  07/05/23 150 lb 3.2 oz (68.1 kg)  12/29/22 148 lb 12.8 oz (67.5 kg)  09/05/22 150 lb 6.4 oz (68.2 kg)   Constitutional: normal weight, in NAD Eyes: EOMI, no exophthalmos ENT: no thyromegaly, no cervical lymphadenopathy Cardiovascular: RRR, No RG, +1/6 SEM Respiratory: CTA B Musculoskeletal: no deformities Skin: no rashes Neurological: no tremor with outstretched hands Diabetic Foot Exam - Simple   Simple Foot Form Diabetic Foot exam was performed with the following findings: Yes 07/05/2023 11:05 AM  Visual Inspection No deformities, no ulcerations, no other skin breakdown bilaterally: Yes Sensation Testing Intact to touch and monofilament testing bilaterally: Yes Pulse Check Posterior Tibialis and Dorsalis pulse intact bilaterally: Yes Comments Ingrown hallux toenail L     ASSESSMENT: 1. DM2, non-insulin-dependent, now controlled, without long-term complications, but with hyperglycemia  2. HL -she has a history of statin induced myopathy  3.  Overweight  PLAN:  1. Patient with longstanding, previously uncontrolled type 2 diabetes, on metformin only.  HbA1c at last visit was at goal, 6.5%, improved.  Sugars were slightly above target in the morning but they were mostly at goal later in the day.  We discussed about moving dinners earlier and increasing activity/exercise but we did not change her regimen.   -At today's visit, sugars remain mainly at goal, without hypoglycemia and without significant hyperglycemia.  Highest blood sugars are in the 140s to 160s, at goal.  No need to change her regimen for now. - I suggested to:  Patient Instructions  Please continue: - Metformin ER 500 mg 2x a day  Please return in 6 months with your sugar log.   - we checked her HbA1c: 6.5% (stable) - advised to check sugars at different times of the day - 1x a day, rotating check times - advised for yearly eye exams >> she is UTD - return to clinic in 6 months  2. HL -The latest lipid panel from 08/2022: LDL much above target, triglycerides also high: Lab Results  Component Value Date   CHOL 242 (H) 08/29/2022   HDL 52 08/29/2022   LDLCALC 157 (H) 08/29/2022   TRIG 179 (H) 08/29/2022   CHOLHDL 4.7 08/29/2022  -She was not able to tolerate statins due to muscle aches.  She tried Crestor 3 times a week and also Zetia and she could not tolerate them.  He refused injectable medications for hyperlipidemia.  However, she is taking red yeast rice 600 mg twice a day with good tolerance.  At last visit I recommended the "portfolio diet". -labs are pending at next appt with PCP in 08/2024.  3.  Overweight -Will continue metformin which has a mild appetite suppressant effect long-term -Before last visit, she lost 2 pounds but she gained this back since last visit  Carlus Pavlov, MD PhD [facility] Endocrinology

## 2023-07-05 NOTE — Patient Instructions (Signed)
Please continue: - Metformin ER 500 mg 2x a day  Please return in 6 months with your sugar log.

## 2023-09-04 ENCOUNTER — Other Ambulatory Visit: Payer: Medicare Other

## 2023-09-04 DIAGNOSIS — Z0000 Encounter for general adult medical examination without abnormal findings: Secondary | ICD-10-CM

## 2023-09-04 DIAGNOSIS — E782 Mixed hyperlipidemia: Secondary | ICD-10-CM

## 2023-09-04 DIAGNOSIS — E1165 Type 2 diabetes mellitus with hyperglycemia: Secondary | ICD-10-CM

## 2023-09-04 DIAGNOSIS — K580 Irritable bowel syndrome with diarrhea: Secondary | ICD-10-CM

## 2023-09-04 DIAGNOSIS — I350 Nonrheumatic aortic (valve) stenosis: Secondary | ICD-10-CM

## 2023-09-04 DIAGNOSIS — I10 Essential (primary) hypertension: Secondary | ICD-10-CM

## 2023-09-04 DIAGNOSIS — E1169 Type 2 diabetes mellitus with other specified complication: Secondary | ICD-10-CM

## 2023-09-04 NOTE — Progress Notes (Signed)
Annual Wellness Visit    Patient Care Team: Franki Alcaide, Luanna Cole, MD as PCP - General (Internal Medicine) Carlus Pavlov, MD as Consulting Physician (Endocrinology)  Visit Date: 09/11/23   Chief Complaint  Patient presents with   Medicare Wellness   Annual Exam    Subjective:   Patient: Courtney Crosby, Female    DOB: 05/31/1946, 77 y.o.   MRN: [id number]  Courtney Crosby is a 77 y.o. Female who presents today for her Annual Wellness Visit. History of arthritis, melanoma, Type 2 diabetes mellitus, hypercholesterolemia, hypertension.  History of Type 2 diabetes mellitus treated with metformin 500 mg twice daily. Sees Dr. Elvera Lennox for diabetes. HGBA1c at 6.9% on 09/04/23, up from 6.5% on 07/05/23. Reports blood sugar measurements at home are below 130.  She tried Crestor 3 times a week for hyperlipidemia.  Could not tolerate that.  Could not tolerate Zetia either.  Takes red yeast rice. CHOL elevated at 224, HDL low at 48, TRIG elevated at 192, LDL elevated at 143.   History of obesity and metabolic syndrome.   Has annual diabetic eye exam at [facility].  Had Herpes zoster left trunk in 2006.   History of GE reflux.   Remote history of melanoma 1991 with no recurrence.  Discussed situational stress and she would like to have something to take as needed. Her husband was recently diagnosed with Alzheimer's.    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  Mild aortic stenosis with gradient 8.4 mmHg in 2021.  No chest pain or shortness of breath.   Past medical history: Fractured right humerus 2007.  History of appendectomy.  History of ingrown toenail surgery bilaterally.  C-sections in 1976 and 1978.  09/04/23 labs reviewed today. Glucose elevated at 130. Kidney, liver functions normal. Electrolytes normal. Blood proteins normal. RBC elevated at 5.31. Hemoglobin elevated at 15.8. HCT elevated at 47.5. Urine creatinine normal. TSH at  1.52.  Mammogram normal on 06/09/23.   Declines colonoscopy but is agreeable to doing Cologuard.   Declines bone density study. Says she will not take bone sparing medication even if she is diagnosed with osteoporosis.   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  Husband is retired Nurse, adult and has developed dementia.  Non-smoker.  Does not consume alcohol.   Family history: Mother with history of diabetes and hypertension.  Past Medical History:  Diagnosis Date   Arthritis    Blood pressure elevated    Cancer (HCC)    melanoma   Cholesterol serum elevated    Diabetes mellitus    H/O: cesarean section 1976 and 1978   History of appendectomy age 89   History of tonsillectomy 1965   Hypercholesteremia 05/18/2011   Hyperlipidemia    Hypertension    Melanoma (HCC) 05/18/2011     Family History  Problem Relation Age of Onset   Diabetes Mother    Heart disease Mother    Hypertension Mother    Hypertension Brother    Diabetes Maternal Grandmother    Heart disease Maternal Grandmother    Hypertension Maternal Grandmother    Hypertension Maternal Grandfather    Hypertension Paternal Grandmother    Hypertension Paternal Grandfather      Social Hx: Married. 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 Husband is a retired Emergency planning/management officer and has dementia. Non-smoker. Does not consume alcohol.    Review of Systems  Constitutional:  Negative for chills, fever, malaise/fatigue and weight loss.  HENT:  Negative for hearing loss, sinus pain and sore throat.   Respiratory:  Negative for cough, hemoptysis and shortness of breath.   Cardiovascular:  Negative for chest pain, palpitations, leg swelling and PND.  Gastrointestinal:  Negative for abdominal pain, constipation, diarrhea, heartburn, nausea and vomiting.  Genitourinary:  Negative for dysuria, frequency and urgency.  Musculoskeletal:  Negative  for back pain, myalgias and neck pain.  Skin:  Negative for itching and rash.  Neurological:  Negative for dizziness, tingling, seizures and headaches.  Endo/Heme/Allergies:  Negative for polydipsia.  Psychiatric/Behavioral:  Negative for depression. The patient is nervous/anxious.       Objective:   Vitals: BP 130/80   Pulse 74   Ht 5\' 3"  (1.6 m)   Wt 150 lb (68 kg)   SpO2 97%   BMI 26.57 kg/m   Physical Exam Vitals and nursing note reviewed.  Constitutional:      General: She is not in acute distress.    Appearance: Normal appearance. She is not ill-appearing or toxic-appearing.  HENT:     Head: Normocephalic and atraumatic.     Right Ear: Hearing, tympanic membrane, ear canal and external ear normal.     Left Ear: Hearing, tympanic membrane, ear canal and external ear normal.     Mouth/Throat:     Pharynx: Oropharynx is clear.  Eyes:     Extraocular Movements: Extraocular movements intact.     Pupils: Pupils are equal, round, and reactive to light.  Neck:     Thyroid: No thyroid mass, thyromegaly or thyroid tenderness.     Vascular: No carotid bruit.  Cardiovascular:     Rate and Rhythm: Normal rate and regular rhythm. No extrasystoles are present.    Pulses:          Dorsalis pedis pulses are 1+ on the right side and 1+ on the left side.     Heart sounds: Murmur heard.     Systolic murmur is present with a grade of 1/6.     No friction rub. No gallop.  Pulmonary:     Effort: Pulmonary effort is normal.     Breath sounds: Normal breath sounds. No decreased breath sounds, wheezing, rhonchi or rales.  Chest:     Chest wall: No mass.  Breasts:    Right: No mass.     Left: No mass.  Abdominal:     Palpations: Abdomen is soft. There is no hepatomegaly, splenomegaly or mass.     Tenderness: There is no abdominal tenderness.     Hernia: No hernia is present.  Musculoskeletal:     Cervical back: Normal range of motion.     Right lower leg: No edema.     Left lower  leg: No edema.  Lymphadenopathy:     Cervical: No cervical adenopathy.     Upper Body:     Right upper body: No supraclavicular adenopathy.     Left upper body: No supraclavicular adenopathy.  Skin:    General: Skin is warm and dry.     Comments: Seborrheic keratosis right ear.  Neurological:     General: No focal deficit present.     Mental Status: She is alert and oriented to person, place, and time. Mental status is at baseline.     Sensory: Sensation is intact.     Motor: Motor function is intact. No weakness.     Deep Tendon Reflexes: Reflexes are normal and  symmetric.  Psychiatric:        Attention and Perception: Attention normal.        Mood and Affect: Mood normal.        Speech: Speech normal.        Behavior: Behavior normal.        Thought Content: Thought content normal.        Cognition and Memory: Cognition normal.        Judgment: Judgment normal.      Most recent functional status assessment:    09/11/2023   11:00 AM  In your present state of health, do you have any difficulty performing the following activities:  Hearing? 0  Vision? 0  Difficulty concentrating or making decisions? 0  Walking or climbing stairs? 0  Dressing or bathing? 0  Doing errands, shopping? 0  Preparing Food and eating ? N  Using the Toilet? N  In the past six months, have you accidently leaked urine? N  Do you have problems with loss of bowel control? N  Managing your Medications? N  Housekeeping or managing your Housekeeping? N   Most recent fall risk assessment:    09/11/2023   11:00 AM  Fall Risk   Falls in the past year? 0  Number falls in past yr: 0  Injury with Fall? 0  Risk for fall due to : No Fall Risks  Follow up Education provided;Falls evaluation completed    Most recent depression screenings:    09/11/2023   11:00 AM 09/05/2022   10:44 AM  PHQ 2/9 Scores  PHQ - 2 Score 0 0   Most recent cognitive screening:    09/11/2023   11:04 AM  6CIT Screen   What Year? 0 points  What month? 0 points  What time? 0 points  Count back from 20 0 points  Months in reverse 0 points  Repeat phrase 0 points  Total Score 0 points     Results:   Studies obtained and personally reviewed by me:  Mammogram normal on 06/09/23.   Labs:       Component Value Date/Time   NA 141 09/04/2023 0910   K 4.8 09/04/2023 0910   CL 102 09/04/2023 0910   CO2 28 09/04/2023 0910   GLUCOSE 130 (H) 09/04/2023 0910   BUN 9 09/04/2023 0910   CREATININE 0.66 09/04/2023 0910   CALCIUM 10.0 09/04/2023 0910   PROT 7.1 09/04/2023 0910   ALBUMIN 4.0 08/02/2016 1112   AST 18 09/04/2023 0910   ALT 16 09/04/2023 0910   ALKPHOS 51 08/02/2016 1112   BILITOT 0.6 09/04/2023 0910   GFRNONAA 80 08/24/2020 1151   GFRAA 93 08/24/2020 1151     Lab Results  Component Value Date   WBC 6.3 09/04/2023   HGB 15.8 (H) 09/04/2023   HCT 47.5 (H) 09/04/2023   MCV 89.5 09/04/2023   PLT 274 09/04/2023    Lab Results  Component Value Date   CHOL 224 (H) 09/04/2023   HDL 48 (L) 09/04/2023   LDLCALC 143 (H) 09/04/2023   TRIG 192 (H) 09/04/2023   CHOLHDL 4.7 09/04/2023    Lab Results  Component Value Date   HGBA1C 6.9 (H) 09/04/2023     Lab Results  Component Value Date   TSH 1.52 09/04/2023    Assessment & Plan:   Irregular nevus right ear: recommended seeing dermatologist. Hx of melanoma  Situational stress with husband who has dementia  Anxiety: prescribed Alprazolam 0.25 mg twice daily  as needed.  Type 2 diabetes mellitus: treated with metformin 500 mg twice daily. Sees Dr. Elvera Lennox for diabetes. HGBA1c at 6.9% on 09/04/23, up from 6.5% on 07/05/23. Reports blood sugar measurements at home are below 130.  Hyperlipidemia (Mixed): takes red yeast rice. CHOL elevated at 224, HDL low at 48, TRIG elevated at 192, LDL elevated at 143. Statin intolerant  Mammogram normal on 06/09/23.   Cologuard ordered.  GERD- not on PPI  BMI 26.57  Vaccine counseling:  Declines vaccines. Has declined bone density study.  Return in 1 year or as needed.     Annual wellness visit done today including the all of the following: Reviewed patient's Family Medical History Reviewed and updated list of patient's medical providers Assessment of cognitive impairment was done Assessed patient's functional ability Established a written schedule for health screening services Health Risk Assessent Completed and Reviewed  Discussed health benefits of physical activity, and encouraged her to engage in regular exercise appropriate for her age and condition.        I,Alexander Ruley,acting as a Neurosurgeon for Margaree Mackintosh, MD.,have documented all relevant documentation on the behalf of Margaree Mackintosh, MD,as directed by  Margaree Mackintosh, MD while in the presence of Margaree Mackintosh, MD.   I, Margaree Mackintosh, MD, have reviewed all documentation for this visit. The documentation on 09/13/23 for the exam, diagnosis, procedures, and orders are all accurate and complete.

## 2023-09-05 LAB — COMPLETE METABOLIC PANEL WITH GFR
AG Ratio: 1.6 (calc) (ref 1.0–2.5)
ALT: 16 U/L (ref 6–29)
AST: 18 U/L (ref 10–35)
Albumin: 4.4 g/dL (ref 3.6–5.1)
Alkaline phosphatase (APISO): 58 U/L (ref 37–153)
BUN: 9 mg/dL (ref 7–25)
CO2: 28 mmol/L (ref 20–32)
Calcium: 10 mg/dL (ref 8.6–10.4)
Chloride: 102 mmol/L (ref 98–110)
Creat: 0.66 mg/dL (ref 0.60–1.00)
Globulin: 2.7 g/dL (ref 1.9–3.7)
Glucose, Bld: 130 mg/dL — ABNORMAL HIGH (ref 65–99)
Potassium: 4.8 mmol/L (ref 3.5–5.3)
Sodium: 141 mmol/L (ref 135–146)
Total Bilirubin: 0.6 mg/dL (ref 0.2–1.2)
Total Protein: 7.1 g/dL (ref 6.1–8.1)
eGFR: 89 mL/min/{1.73_m2} (ref >=60)

## 2023-09-05 LAB — LIPID PANEL
Cholesterol: 224 mg/dL — ABNORMAL HIGH (ref <200)
HDL: 48 mg/dL — ABNORMAL LOW (ref >=50)
LDL Cholesterol (Calc): 143 mg/dL — ABNORMAL HIGH
Non-HDL Cholesterol (Calc): 176 mg/dL — ABNORMAL HIGH (ref <130)
Total CHOL/HDL Ratio: 4.7 (calc) (ref <5.0)
Triglycerides: 192 mg/dL — ABNORMAL HIGH (ref <150)

## 2023-09-05 LAB — CBC WITH DIFFERENTIAL/PLATELET
Absolute Lymphocytes: 1991 {cells}/uL (ref 850–3900)
Absolute Monocytes: 630 {cells}/uL (ref 200–950)
Basophils Absolute: 69 {cells}/uL (ref 0–200)
Basophils Relative: 1.1 %
Eosinophils Absolute: 151 {cells}/uL (ref 15–500)
Eosinophils Relative: 2.4 %
HCT: 47.5 % — ABNORMAL HIGH (ref 35.0–45.0)
Hemoglobin: 15.8 g/dL — ABNORMAL HIGH (ref 11.7–15.5)
MCH: 29.8 pg (ref 27.0–33.0)
MCHC: 33.3 g/dL (ref 32.0–36.0)
MCV: 89.5 fL (ref 80.0–100.0)
MPV: 11.4 fL (ref 7.5–12.5)
Monocytes Relative: 10 %
Neutro Abs: 3459 {cells}/uL (ref 1500–7800)
Neutrophils Relative %: 54.9 %
Platelets: 274 10*3/uL (ref 140–400)
RBC: 5.31 10*6/uL — ABNORMAL HIGH (ref 3.80–5.10)
RDW: 12.5 % (ref 11.0–15.0)
Total Lymphocyte: 31.6 %
WBC: 6.3 10*3/uL (ref 3.8–10.8)

## 2023-09-05 LAB — HEMOGLOBIN A1C
Hgb A1c MFr Bld: 6.9 %{Hb} — ABNORMAL HIGH (ref <5.7)
Mean Plasma Glucose: 151 mg/dL
eAG (mmol/L): 8.4 mmol/L

## 2023-09-05 LAB — MICROALBUMIN / CREATININE URINE RATIO
Creatinine, Urine: 65 mg/dL (ref 20–275)
Microalb Creat Ratio: 31 mg/g{creat} — ABNORMAL HIGH (ref <30)
Microalb, Ur: 2 mg/dL

## 2023-09-05 LAB — TSH: TSH: 1.52 m[IU]/L (ref 0.40–4.50)

## 2023-09-11 ENCOUNTER — Other Ambulatory Visit (HOSPITAL_COMMUNITY): Payer: Self-pay

## 2023-09-11 ENCOUNTER — Ambulatory Visit: Payer: Medicare Other | Admitting: Internal Medicine

## 2023-09-11 ENCOUNTER — Encounter: Payer: Self-pay | Admitting: Internal Medicine

## 2023-09-11 VITALS — BP 130/80 | HR 74 | Ht 63.0 in | Wt 150.0 lb

## 2023-09-11 DIAGNOSIS — E1165 Type 2 diabetes mellitus with hyperglycemia: Secondary | ICD-10-CM

## 2023-09-11 DIAGNOSIS — Z6826 Body mass index (BMI) 26.0-26.9, adult: Secondary | ICD-10-CM

## 2023-09-11 DIAGNOSIS — F439 Reaction to severe stress, unspecified: Secondary | ICD-10-CM

## 2023-09-11 DIAGNOSIS — E782 Mixed hyperlipidemia: Secondary | ICD-10-CM

## 2023-09-11 DIAGNOSIS — R748 Abnormal levels of other serum enzymes: Secondary | ICD-10-CM

## 2023-09-11 DIAGNOSIS — I10 Essential (primary) hypertension: Secondary | ICD-10-CM

## 2023-09-11 DIAGNOSIS — E785 Hyperlipidemia, unspecified: Secondary | ICD-10-CM

## 2023-09-11 DIAGNOSIS — Z85820 Personal history of malignant melanoma of skin: Secondary | ICD-10-CM

## 2023-09-11 DIAGNOSIS — Z1211 Encounter for screening for malignant neoplasm of colon: Secondary | ICD-10-CM

## 2023-09-11 DIAGNOSIS — D2221 Melanocytic nevi of right ear and external auricular canal: Secondary | ICD-10-CM

## 2023-09-11 DIAGNOSIS — K580 Irritable bowel syndrome with diarrhea: Secondary | ICD-10-CM | POA: Diagnosis not present

## 2023-09-11 DIAGNOSIS — Z8719 Personal history of other diseases of the digestive system: Secondary | ICD-10-CM

## 2023-09-11 DIAGNOSIS — E1169 Type 2 diabetes mellitus with other specified complication: Secondary | ICD-10-CM

## 2023-09-11 DIAGNOSIS — Z0000 Encounter for general adult medical examination without abnormal findings: Secondary | ICD-10-CM

## 2023-09-11 DIAGNOSIS — Z789 Other specified health status: Secondary | ICD-10-CM

## 2023-09-11 DIAGNOSIS — I350 Nonrheumatic aortic (valve) stenosis: Secondary | ICD-10-CM

## 2023-09-11 LAB — POCT URINALYSIS DIP (CLINITEK)
Bilirubin, UA: NEGATIVE
Blood, UA: NEGATIVE
Glucose, UA: NEGATIVE mg/dL
Ketones, POC UA: NEGATIVE mg/dL
Leukocytes, UA: NEGATIVE
Nitrite, UA: NEGATIVE
POC PROTEIN,UA: NEGATIVE
Spec Grav, UA: 1.015 (ref 1.010–1.025)
Urobilinogen, UA: 0.2 U/dL
pH, UA: 6 (ref 5.0–8.0)

## 2023-09-11 MED ORDER — ALPRAZOLAM 0.25 MG PO TABS
0.2500 mg | ORAL_TABLET | Freq: Two times a day (BID) | ORAL | 1 refills | Status: AC | PRN
  Filled 2023-09-11: qty 30, 15d supply, fill #0

## 2023-09-13 NOTE — Patient Instructions (Addendum)
As always, it was a pleasure to see you today.  Vaccines declined by patient.  Bone density study declined.  Diabetic control is stable under the excellent care of Dr. Wyonia Hough.  Cologuard has been ordered.  Mammogram was normal in August 2024.  Continue Xanax for situational stress and anxiety as needed.  Please see dermatologist regarding irregular nevus of right ear with history of prior melanoma.  Alprazolam refilled as requested.  May return in 1 year or as needed.

## 2023-11-14 ENCOUNTER — Other Ambulatory Visit: Payer: Self-pay

## 2023-11-14 ENCOUNTER — Other Ambulatory Visit (HOSPITAL_COMMUNITY): Payer: Self-pay

## 2023-11-14 MED FILL — Glucose Blood Test Strip: 50 days supply | Qty: 100 | Fill #2 | Status: CN

## 2023-11-14 MED FILL — Glucose Blood Test Strip: 50 days supply | Qty: 100 | Fill #2 | Status: AC

## 2023-11-14 MED FILL — Glucose Blood Test Strip: 75 days supply | Qty: 150 | Fill #2 | Status: CN

## 2023-11-15 ENCOUNTER — Other Ambulatory Visit: Payer: Self-pay

## 2024-01-02 ENCOUNTER — Encounter: Payer: Self-pay | Admitting: Internal Medicine

## 2024-01-02 ENCOUNTER — Other Ambulatory Visit (HOSPITAL_COMMUNITY): Payer: Self-pay

## 2024-01-02 ENCOUNTER — Ambulatory Visit: Payer: Medicare Other | Admitting: Internal Medicine

## 2024-01-02 VITALS — BP 120/70 | HR 88 | Ht 63.0 in | Wt 148.2 lb

## 2024-01-02 DIAGNOSIS — Z7984 Long term (current) use of oral hypoglycemic drugs: Secondary | ICD-10-CM | POA: Diagnosis not present

## 2024-01-02 DIAGNOSIS — E1165 Type 2 diabetes mellitus with hyperglycemia: Secondary | ICD-10-CM

## 2024-01-02 DIAGNOSIS — E663 Overweight: Secondary | ICD-10-CM | POA: Diagnosis not present

## 2024-01-02 DIAGNOSIS — E7800 Pure hypercholesterolemia, unspecified: Secondary | ICD-10-CM | POA: Diagnosis not present

## 2024-01-02 LAB — GLUCOSE, POCT (MANUAL RESULT ENTRY): POC Glucose: 167 mg/dL — AB (ref 70–99)

## 2024-01-02 LAB — POCT GLYCOSYLATED HEMOGLOBIN (HGB A1C): Hemoglobin A1C: 6.5 % — AB (ref 4.0–5.6)

## 2024-01-02 MED ORDER — METFORMIN HCL ER 500 MG PO TB24
500.0000 mg | ORAL_TABLET | Freq: Two times a day (BID) | ORAL | 3 refills | Status: AC
  Filled 2024-01-02 – 2024-02-29 (×2): qty 180, 90d supply, fill #0
  Filled 2024-05-28: qty 180, 90d supply, fill #1
  Filled 2024-09-12: qty 180, 90d supply, fill #2

## 2024-01-02 NOTE — Progress Notes (Signed)
 HPI: Courtney Crosby is a 78 y.o.-year-old female, returning for follow-up for DM2, dx in ~2015, non-insulin-dependent, now controlled, without long-term complications.  Last visit 6  months ago.  Interim history: No increased urination, blurry vision, nausea, chest pain.  She is stressed about her husband who continues to have worsening Alzheimer's dementia.  She does not have help with him.  At today's visit, she left him in the car doing crossword puzzles, to come for the appointment.  Reviewed HbA1c levels: Lab Results  Component Value Date   HGBA1C 6.9 (H) 09/04/2023   HGBA1C 6.5 (A) 07/05/2023   HGBA1C 6.5 (A) 12/29/2022   HGBA1C 6.9 (H) 08/29/2022   HGBA1C 6.5 (A) 06/28/2022   HGBA1C 6.6 (A) 12/23/2021   HGBA1C 6.8 (H) 08/30/2021   HGBA1C 6.8 (A) 08/23/2021   HGBA1C 6.5 (A) 04/30/2021   HGBA1C 6.5 (A) 12/25/2020   She is on - Metformin ER 1000 mg with dinner -started 09/2018 - diarrhea >> we had to reduce the dose >> 500 mg 2x a day We stopped Januvia 100 mg before b'fast 2/2 $$$-initially had soft stools with this, then they resolved She did not try Metformin >> mother and aunt had CKD while on this.  Pt checks her sugars once a day: - am: 100-131, 149, 164 >> 114-147 >> 116-138 >> 109-145, 164, 165 - 2h after b'fast: 115-129 >> 107 >> 129, 146 >> 146 - before lunch:101-127 >> 103-134, 147 >> 109-147 >> 93-130 - 2h after lunch: 114-147, 187 >> n/c   >> 102 >> 109, 137 >> 128 - before dinner: 108, 116, 143 >> 105-144 >> 101-144 >> 90-121, 157 - 2h after dinner: 144, 146 >> 138-140 >> 123-133 >> 135  >> n/c - bedtime: 95, 111 >> 142 >> 111-176 >> 108-141, 161 >> 102-135 - nighttime: 130-140's >> 113 >> 129 >> n/c Lowest sugar was 60 x1 >> .Marland Kitchen. 101 >> 90; she has hypoglycemia awareness in the 70s. Highest sugar was: 210 >> ...  206 >> 161 >> 165.  Glucometer: One Touch.  Pt's meals are: - Breakfast: Eggo waffle and PB, cinnamon, honey - Lunch: Eats out - chick  fil-a - Dinner: Eats out or spaghetti - Snacks: no usually, occas. At night - frozen grapes, chocolate candy, apples, berries She used to drink sodas but stopped.  -No CKD, last BUN/creatinine:  Lab Results  Component Value Date   BUN 9 09/04/2023   BUN 10 08/29/2022   CREATININE 0.66 09/04/2023   CREATININE 0.67 08/29/2022   Lab Results  Component Value Date   MICRALBCREAT 31 (H) 09/04/2023   MICRALBCREAT 19 09/05/2022   MICRALBCREAT 13 08/30/2021   MICRALBCREAT 15 08/24/2020   MICRALBCREAT 6 08/20/2018   MICRALBCREAT 3 08/07/2017   MICRALBCREAT 6 01/31/2017  Off lisinopril.  -+ HL; last set of lipids: Lab Results  Component Value Date   CHOL 224 (H) 09/04/2023   CHOL 242 (H) 08/29/2022   CHOL 215 (H) 08/30/2021   Lab Results  Component Value Date   HDL 48 (L) 09/04/2023   HDL 52 08/29/2022   HDL 47 (L) 08/30/2021   Lab Results  Component Value Date   LDLCALC 143 (H) 09/04/2023   LDLCALC 157 (H) 08/29/2022   LDLCALC 141 (H) 08/30/2021   Lab Results  Component Value Date   TRIG 192 (H) 09/04/2023   TRIG 179 (H) 08/29/2022   TRIG 147 08/30/2021  Intolerant to statins due to myalgias and muscle weakness. She tried Crestor  3 times a week but could not tolerate it. She could not tolerate Zetia. She is on Red Yeast Rice 600 mg twice a day.  - last eye exam was on 02/21/2023: No DR  - no numbness and tingling in her feet.  Last foot exam 07/05/2023.  Pt has FH of DM in mother, MGM, aunts and uncles, cousins.  She has a history of HTN, distant history of melanoma -> 20 years ago, seasonal depression during winter, lichen sclerosis, osteoarthritis of thumbs.  She also has a history of gout.  She is a retired Engineer, civil (consulting).  ROS: + see HPI  Past Medical History:  Diagnosis Date   Arthritis    Blood pressure elevated    Cancer (HCC)    melanoma   Cholesterol serum elevated    Diabetes mellitus    H/O: cesarean section 1976 and 1978   History of appendectomy age  77   History of tonsillectomy 1965   Hypercholesteremia 05/18/2011   Hyperlipidemia    Hypertension    Melanoma (HCC) 05/18/2011   Past Surgical History:  Procedure Laterality Date   APPENDECTOMY   1958   CESAREAN SECTION  1976 and 1978   TONSILLECTOMY  1965   Social History   Socioeconomic History   Marital status: Married    Spouse name: Not on file   Number of children: 2   Years of education: Not on file   Highest education level: Not on file  Occupational History    Retired Engineer, civil (consulting)  Tobacco Use   Smoking status: Never Smoker   Smokeless tobacco: Never Used  Substance and Sexual Activity   Alcohol use: No   Drug use: No   Current Outpatient Medications on File Prior to Visit  Medication Sig Dispense Refill   ALPRAZolam (XANAX) 0.25 MG tablet Take 1 tablet (0.25 mg total) by mouth 2 (two) times daily as needed for anxiety. 30 tablet 1   blood glucose meter kit and supplies KIT Dispense based on patient and insurance preference. Use up to four times daily as directed. (FOR ICD-9 250.00, 250.01). 1 each 0   Clobetasol Prop Emollient Base (CLOBETASOL PROPIONATE E) 0.05 % emollient cream APPLY TO AFFECTED AREA EVERY DAY X 2-3 WEEKS,THEN EVERY OTHER DAY X2-3WEEKS,THEN TWICE PER WEEK 60 g 1   glucose blood test strip Use as instructed 2x a day - On Touch Verio device 200 each 3   Lancets (ONETOUCH DELICA PLUS LANCET33G) MISC Use up to 2 (two) times daily as directed 200 each 3   metFORMIN (GLUCOPHAGE-XR) 500 MG 24 hr tablet Take 1 tablet (500 mg total) by mouth in the morning and at bedtime. 180 tablet 3   naproxen sodium (ANAPROX) 220 MG tablet Take 220 mg by mouth 2 (two) times daily with a meal.     glucose blood (ONETOUCH ULTRA TEST) test strip Use 2 (two) times daily as directed 200 each 3   Red Yeast Rice Extract (RED YEAST RICE PO) Take by mouth.     No current facility-administered medications on file prior to visit.   Allergies  Allergen Reactions   Codeine     Epinephrine Palpitations    Takes hours for reaction of increase pulse and BP.   Family History  Problem Relation Age of Onset   Diabetes Mother    Heart disease Mother    Hypertension Mother    Hypertension Brother    Diabetes Maternal Grandmother    Heart disease Maternal Grandmother    Hypertension  Maternal Grandmother    Hypertension Maternal Grandfather    Hypertension Paternal Grandmother    Hypertension Paternal Grandfather    PE: BP 120/70   Pulse 88   Ht 5\' 3"  (1.6 m)   Wt 148 lb 3.2 oz (67.2 kg)   SpO2 97%   BMI 26.25 kg/m  Wt Readings from Last 10 Encounters:  01/02/24 148 lb 3.2 oz (67.2 kg)  09/11/23 150 lb (68 kg)  07/05/23 150 lb 3.2 oz (68.1 kg)  12/29/22 148 lb 12.8 oz (67.5 kg)  09/05/22 150 lb 6.4 oz (68.2 kg)  06/28/22 152 lb (68.9 kg)  12/23/21 152 lb 6.4 oz (69.1 kg)  08/31/21 153 lb (69.4 kg)  08/23/21 155 lb (70.3 kg)  04/30/21 155 lb 6.4 oz (70.5 kg)   Constitutional: normal weight, in NAD Eyes: EOMI, no exophthalmos ENT: no thyromegaly, no cervical lymphadenopathy Cardiovascular: RRR, No RG, +1/6 SEM Respiratory: CTA B Musculoskeletal: no deformities Skin: no rashes Neurological: no tremor with outstretched hands  ASSESSMENT: 1. DM2, non-insulin-dependent, now controlled, without long-term complications, but with hyperglycemia  2. HL -she has a history of statin induced myopathy  3.  Overweight  PLAN:  1. Patient with longstanding, previously uncontrolled type 2 diabetes, on metformin only.  HbA1c at last visit was stable, at 6.5% but since then, she had another HbA1c obtained 4 months ago which was slightly higher, at 6.9%. -At last visit, sugars remained mainly at goal, without hypoglycemia and without significant hyperglycemia.  We did not change her regimen at that time.  She is on a half maximal dose of metformin daily. -At today's visit, sugars are mostly at goal but she does have hyperglycemic exceptions.  She brings a good log  but she is not writing down possible causes causes for hyperglycemia in her log.  She does feel that some of them are due to forgetting metformin.  I advised her to write down why she feels that the sugars may be higher, but otherwise, I did not suggest a change in regimen especially in the setting of an improved HbA1c (see below).  I refilled her metformin today. - I suggested to:  Patient Instructions  Please continue: - Metformin ER 500 mg 2x a day  Please return in 6 months with your sugar log.   - we checked her HbA1c: 6.5% (lower) - advised to check sugars at different times of the day - 1x a day, rotating check times - advised for yearly eye exams >> she is UTD - return to clinic in 6 months  2. HL -Placed lipid panel from 08/2023 was reviewed: All fractions abnormal: Lab Results  Component Value Date   CHOL 224 (H) 09/04/2023   HDL 48 (L) 09/04/2023   LDLCALC 143 (H) 09/04/2023   TRIG 192 (H) 09/04/2023   CHOLHDL 4.7 09/04/2023  -She is not able to tolerate statins due to muscle aches. She tried Crestor 3 times a week and also Zetia and she could not tolerate them.  He refused injectable medications for hyperlipidemia.  However, she is now taking red yeast rice 600 mg twice a day with good tolerance.  I previously recommended the "portfolio diet" but she did not try this. -Will continue the Reddys tries, since she again declines statins and injectables and could not tolerate Zetia.  3.  Overweight -Will continue metformin which has a mild appetite suppressant effect long-term -Weight was approximately stable at the last few visits. She lost 2 lbs since last OV.  Carlus Pavlov, MD PhD [facility]

## 2024-01-02 NOTE — Patient Instructions (Signed)
Please continue: - Metformin ER 500 mg 2x a day  Please return in 6 months with your sugar log.

## 2024-01-22 ENCOUNTER — Other Ambulatory Visit (HOSPITAL_COMMUNITY): Payer: Self-pay

## 2024-02-29 ENCOUNTER — Other Ambulatory Visit: Payer: Self-pay

## 2024-02-29 ENCOUNTER — Other Ambulatory Visit (HOSPITAL_COMMUNITY): Payer: Self-pay

## 2024-05-01 ENCOUNTER — Other Ambulatory Visit (HOSPITAL_COMMUNITY): Payer: Self-pay

## 2024-05-01 ENCOUNTER — Telehealth: Payer: Self-pay | Admitting: Internal Medicine

## 2024-05-01 DIAGNOSIS — E1165 Type 2 diabetes mellitus with hyperglycemia: Secondary | ICD-10-CM

## 2024-05-01 MED ORDER — ONETOUCH DELICA PLUS LANCET33G MISC
Freq: Two times a day (BID) | 12 refills | Status: AC
  Filled 2024-05-01: qty 100, 50d supply, fill #0
  Filled 2024-10-25: qty 100, 50d supply, fill #1

## 2024-05-01 MED ORDER — ONETOUCH ULTRA TEST VI STRP
ORAL_STRIP | 12 refills | Status: AC
  Filled 2024-05-01: qty 100, 50d supply, fill #0
  Filled 2024-10-25: qty 100, 50d supply, fill #1

## 2024-05-01 MED ORDER — ONETOUCH ULTRA TEST VI STRP
ORAL_STRIP | Freq: Two times a day (BID) | 3 refills | Status: DC
  Filled 2024-05-01: qty 200, fill #0

## 2024-05-01 MED ORDER — ONETOUCH DELICA PLUS LANCET33G MISC
Freq: Two times a day (BID) | 3 refills | Status: DC
  Filled 2024-05-01: qty 200, fill #0

## 2024-05-01 NOTE — Telephone Encounter (Signed)
 Requested Prescriptions   Signed Prescriptions Disp Refills   glucose blood (ONETOUCH ULTRA TEST) test strip 200 each 12    Sig: Use to check blood sugar 2 times a day. DxCode:E11.65    Authorizing Provider: TRIXIE FILE    Ordering User: CLEOTILDE, Alyxis Grippi S   Lancets (ONETOUCH DELICA PLUS LANCET33G) MISC 200 each 12    Sig: Use to check blood sugar 2 times a day. DxCode:E11.65    Authorizing Provider: TRIXIE FILE    Ordering User: CLEOTILDE ROLIN RAMAN

## 2024-05-01 NOTE — Telephone Encounter (Signed)
 MEDICATION:  1)  Lancets (ONETOUCH DELICA PLUS LANCET33G) MISC   2)  glucose blood (ONETOUCH ULTRA TEST) test strip   PHARMACY:   [facility]  Address: [address], [place], [place] [address]  HAS THE PATIENT CONTACTED THEIR PHARMACY?  Yes  IS THIS A 90 DAY SUPPLY : Yes  IS PATIENT OUT OF MEDICATION: No  IF NOT; HOW MUCH IS LEFT: 1 week  LAST APPOINTMENT DATE: @3 /25/2025  NEXT APPOINTMENT DATE:@9 /25/2025  DO WE HAVE YOUR PERMISSION TO LEAVE A DETAILED MESSAGE?:Yes  OTHER COMMENTS:    **Let patient know to contact pharmacy at the end of the day to make sure medication is ready. **  ** Please notify patient to allow 48-72 hours to process**  **Encourage patient to contact the pharmacy for refills or they can request refills through [facility]**

## 2024-05-02 ENCOUNTER — Other Ambulatory Visit (HOSPITAL_COMMUNITY): Payer: Self-pay

## 2024-05-28 ENCOUNTER — Other Ambulatory Visit (HOSPITAL_COMMUNITY): Payer: Self-pay

## 2024-06-24 ENCOUNTER — Encounter: Payer: Self-pay | Admitting: Internal Medicine

## 2024-07-04 ENCOUNTER — Encounter: Payer: Self-pay | Admitting: Internal Medicine

## 2024-07-04 ENCOUNTER — Ambulatory Visit: Admitting: Internal Medicine

## 2024-07-04 VITALS — BP 136/80 | HR 85 | Ht 63.0 in | Wt 151.0 lb

## 2024-07-04 DIAGNOSIS — E663 Overweight: Secondary | ICD-10-CM | POA: Diagnosis not present

## 2024-07-04 DIAGNOSIS — E1165 Type 2 diabetes mellitus with hyperglycemia: Secondary | ICD-10-CM | POA: Diagnosis not present

## 2024-07-04 DIAGNOSIS — E7800 Pure hypercholesterolemia, unspecified: Secondary | ICD-10-CM

## 2024-07-04 LAB — POCT GLYCOSYLATED HEMOGLOBIN (HGB A1C): Hemoglobin A1C: 6.4 % — AB (ref 4.0–5.6)

## 2024-07-04 NOTE — Addendum Note (Signed)
 Addended by: OCTAVIO DIETRICH CROME on: 07/04/2024 01:46 PM   Modules accepted: Orders

## 2024-07-04 NOTE — Progress Notes (Signed)
 HPI: Courtney Crosby is a 78 y.o.-year-old female, returning for follow-up for DM2, dx in ~2015, non-insulin-dependent, now controlled, without long-term complications.  Last visit 6  months ago.  Interim history: No increased urination, blurry vision, nausea, chest pain.   Reviewed HbA1c levels: Lab Results  Component Value Date   HGBA1C 6.5 (A) 01/02/2024   HGBA1C 6.9 (H) 09/04/2023   HGBA1C 6.5 (A) 07/05/2023   HGBA1C 6.5 (A) 12/29/2022   HGBA1C 6.9 (H) 08/29/2022   HGBA1C 6.5 (A) 06/28/2022   HGBA1C 6.6 (A) 12/23/2021   HGBA1C 6.8 (H) 08/30/2021   HGBA1C 6.8 (A) 08/23/2021   HGBA1C 6.5 (A) 04/30/2021   She is on - Metformin  ER 1000 mg with dinner -started 09/2018 - diarrhea >> we had to reduce the dose >> 500 mg 2x a day We stopped Januvia  100 mg before b'fast 2/2 $$$-initially had soft stools with this, then they resolved She did not try Metformin  >> mother and aunt had CKD while on this.  Pt checks her sugars once a day: - am: 114-147 >> 116-138 >> 109-145, 164, 165 >> 107-122, 132 - 2h after b'fast: 115-129 >> 107 >> 129, 146 >> 146 >> n/c - before lunch: 103-134, 147 >> 109-147 >> 93-130 >> 112-121, 151 - 2h after lunch:  n/c   >> 102 >> 109, 137 >> 128 >> n/c - before dinner: 105-144 >> 101-144 >> 90-121, 157 >> 85, 151 - 2h after dinner:  138-140 >> 123-133 >> 135  >> n/c - bedtime: 142 >> 111-176 >> 108-141, 161 >> 102-135 >> 122-156 - nighttime: 130-140's >> 113 >> 129 >> n/c >> 118 Lowest sugar was 60 x1 >> .[name]. 101 >> 90  >> 85; she has hypoglycemia awareness in the 70s. Highest sugar was: 206 >> 161 >> 165 >> 156.  Glucometer: One Touch.  Pt's meals are: - Breakfast: Eggo waffle and PB, cinnamon, honey - Lunch: Eats out - chick fil-a - Dinner: Eats out or spaghetti - Snacks: no usually, occas. At night - frozen grapes, chocolate candy, apples, berries She used to drink sodas but stopped.  -No CKD, last BUN/creatinine:  Lab Results  Component Value  Date   BUN 9 09/04/2023   BUN 10 08/29/2022   CREATININE 0.66 09/04/2023   CREATININE 0.67 08/29/2022   Lab Results  Component Value Date   MICRALBCREAT 31 (H) 09/04/2023   MICRALBCREAT 19 09/05/2022   MICRALBCREAT 13 08/30/2021   MICRALBCREAT 15 08/24/2020   MICRALBCREAT 6 08/20/2018   MICRALBCREAT 3 08/07/2017   MICRALBCREAT 6 01/31/2017  Off lisinopril .  -+ HL; last set of lipids: Lab Results  Component Value Date   CHOL 224 (H) 09/04/2023   CHOL 242 (H) 08/29/2022   CHOL 215 (H) 08/30/2021   Lab Results  Component Value Date   HDL 48 (L) 09/04/2023   HDL 52 08/29/2022   HDL 47 (L) 08/30/2021   Lab Results  Component Value Date   LDLCALC 143 (H) 09/04/2023   LDLCALC 157 (H) 08/29/2022   LDLCALC 141 (H) 08/30/2021   Lab Results  Component Value Date   TRIG 192 (H) 09/04/2023   TRIG 179 (H) 08/29/2022   TRIG 147 08/30/2021  Intolerant to statins due to myalgias and muscle weakness. She tried Crestor  3 times a week but could not tolerate it. She could not tolerate Zetia . She is on Red Yeast Rice 600 mg twice a day.  - last eye exam was on 02/21/2023: No DR  -  no numbness and tingling in her feet.  Last foot exam 07/05/2023.  Pt has FH of DM in mother, MGM, aunts and uncles, cousins.  She has a history of HTN, distant history of melanoma -> 20 years ago, seasonal depression during winter, lichen sclerosis, osteoarthritis of thumbs.  She also has a history of gout.  She is a retired Engineer, civil (consulting). She is stressed about her husband who has Alzheimer's dementia.  She does not have help with him.     ROS: + see HPI  Past Medical History:  Diagnosis Date   Arthritis    Blood pressure elevated    Cancer (HCC)    melanoma   Cholesterol serum elevated    Diabetes mellitus    H/O: cesarean section 1976 and 1978   History of appendectomy age 57   History of tonsillectomy 1965   Hypercholesteremia 05/18/2011   Hyperlipidemia    Hypertension    Melanoma (HCC) 05/18/2011    Past Surgical History:  Procedure Laterality Date   APPENDECTOMY   1958   CESAREAN SECTION  1976 and 1978   TONSILLECTOMY  1965   Social History   Socioeconomic History   Marital status: Married    Spouse name: Not on file   Number of children: 2   Years of education: Not on file   Highest education level: Not on file  Occupational History    Retired Engineer, civil (consulting)  Tobacco Use   Smoking status: Never Smoker   Smokeless tobacco: Never Used  Substance and Sexual Activity   Alcohol use: No   Drug use: No   Current Outpatient Medications on File Prior to Visit  Medication Sig Dispense Refill   ALPRAZolam  (XANAX ) 0.25 MG tablet Take 1 tablet (0.25 mg total) by mouth 2 (two) times daily as needed for anxiety. 30 tablet 1   blood glucose meter kit and supplies KIT Dispense based on patient and insurance preference. Use up to four times daily as directed. (FOR ICD-9 250.00, 250.01). 1 each 0   Clobetasol  Prop Emollient Base (CLOBETASOL  PROPIONATE E) 0.05 % emollient cream APPLY TO AFFECTED AREA EVERY DAY X 2-3 WEEKS,THEN EVERY OTHER DAY X2-3WEEKS,THEN TWICE PER WEEK 60 g 1   glucose blood (ONETOUCH ULTRA TEST) test strip Use to check blood sugar 2 times a day. 200 each 12   glucose blood test strip Use as instructed 2x a day - On Touch Verio device 200 each 3   Lancets (ONETOUCH DELICA PLUS LANCET33G) MISC Use to check blood sugar 2 times a day. 200 each 12   metFORMIN  (GLUCOPHAGE -XR) 500 MG 24 hr tablet Take 1 tablet (500 mg total) by mouth in the morning and at bedtime. 180 tablet 3   naproxen sodium (ANAPROX) 220 MG tablet Take 220 mg by mouth 2 (two) times daily with a meal.     Red Yeast Rice Extract (RED YEAST RICE PO) Take by mouth.     No current facility-administered medications on file prior to visit.   Allergies  Allergen Reactions   Codeine    Epinephrine Palpitations    Takes hours for reaction of increase pulse and BP.   Family History  Problem Relation Age of Onset    Diabetes Mother    Heart disease Mother    Hypertension Mother    Hypertension Brother    Diabetes Maternal Grandmother    Heart disease Maternal Grandmother    Hypertension Maternal Grandmother    Hypertension Maternal Grandfather    Hypertension Paternal Grandmother  Hypertension Paternal Grandfather    PE: BP 136/80 (BP Location: Left Arm, Patient Position: Sitting, Cuff Size: Large)   Pulse 85   Ht 5' 3 (1.6 m)   Wt 151 lb (68.5 kg)   SpO2 97%   BMI 26.75 kg/m  Wt Readings from Last 10 Encounters:  07/04/24 151 lb (68.5 kg)  01/02/24 148 lb 3.2 oz (67.2 kg)  09/11/23 150 lb (68 kg)  07/05/23 150 lb 3.2 oz (68.1 kg)  12/29/22 148 lb 12.8 oz (67.5 kg)  09/05/22 150 lb 6.4 oz (68.2 kg)  06/28/22 152 lb (68.9 kg)  12/23/21 152 lb 6.4 oz (69.1 kg)  08/31/21 153 lb (69.4 kg)  08/23/21 155 lb (70.3 kg)   Constitutional: normal weight, in NAD Eyes: EOMI, no exophthalmos ENT: no thyromegaly, no cervical lymphadenopathy Cardiovascular: RRR, No RG, +1/6 SEM Respiratory: CTA B Musculoskeletal: no deformities Skin: no rashes Neurological: no tremor with outstretched hands Diabetic Foot Exam - Simple   Simple Foot Form Diabetic Foot exam was performed with the following findings: Yes 07/04/2024 11:39 AM  Visual Inspection No deformities, no ulcerations, no other skin breakdown bilaterally: Yes Sensation Testing Intact to touch and monofilament testing bilaterally: Yes Pulse Check Posterior Tibialis and Dorsalis pulse intact bilaterally: Yes Comments    ASSESSMENT: 1. DM2, non-insulin-dependent, now controlled, without long-term complications, but with hyperglycemia  2. HL -she has a history of statin induced myopathy  3.  Overweight  PLAN:  1. Patient with longstanding, previously uncontrolled type 2 diabetes, on metformin  ER only.  HbA1c at last visit improved from 6.9% to 6.5%.  Sugars remained mostly at goal with occasional hyperglycemic exceptions.  We  discussed about writing down possible causes for the hyperglycemic values in her log but otherwise I did not recommend a change in regimen at that time.  I refilled her metformin . -At today's visit, the majority of her blood sugars are at goal.  She is not checking blood sugars daily but she does a good job rotating check times throughout the day.  She also writes down possible causes for down abnormal blood sugars in her log.  No need to change her regimen for now. - I suggested to:  Patient Instructions  Please continue: - Metformin  ER 500 mg 2x a day  Please return in 6 months with your sugar log.   - we checked her HbA1c: 6.4% (lower) - advised to check sugars at different times of the day - 1x a day, rotating check times - advised for yearly eye exams >> she is not UTD - she has an annual physical exam with PCP in 09/2024 with labs checked before the appointment. - return to clinic in 6 months  2. HL - Latest lipid panel from 08/30/2023 showed an elevated LDL, elevated triglycerides, and slightly low HDL: Lab Results  Component Value Date   CHOL 224 (H) 09/04/2023   HDL 48 (L) 09/04/2023   LDLCALC 143 (H) 09/04/2023   TRIG 192 (H) 09/04/2023   CHOLHDL 4.7 09/04/2023  -She is not able to tolerate statins due to muscle aches. She tried Crestor  3 times a week and also Zetia  and she could not tolerate them.  He refused injectable medications for hyperlipidemia.  However, she is now taking red yeast rice 600 mg twice a day with good tolerance.  I previously recommended the portfolio diet but she did not try this. - At last visit we continued red yeast rice, since she again declined statins and injectables and  could not tolerate Zetia   3.  Overweight - Will continue metformin  which has a mild appetite suppressant effect long-term - She lost 2 pounds before last visit and gained 3 pounds since then  Lela Fendt, MD PhD [facility] Endocrinology

## 2024-07-04 NOTE — Patient Instructions (Signed)
Please continue: - Metformin ER 500 mg 2x a day  Please return in 6 months with your sugar log.

## 2024-09-02 NOTE — Progress Notes (Signed)
 "  Annual Wellness Visit   Patient Care Team: Raymir Frommelt, Ronal PARAS, MD as PCP - General (Internal Medicine) Trixie File, MD as Consulting Physician (Endocrinology)  Visit Date: 09/12/24   Chief Complaint  Patient presents with   Annual Exam   Medicare Wellness   Subjective:  Patient: Courtney Crosby, Female DOB: Nov 02, 1945, 78 y.o. MRN: [id number] Vitals:   09/12/24 1114  BP: 120/80   Courtney Crosby is a 78 y.o. Female who presents today for her Annual Wellness Visit.   Patient has hx hyperlipidemia treated with statin,GERD, Type 2 diabetes,obesity and metabolic syndrome Melanoma was in 1991 and she has had no recurrence.  History of Diabetes Mellitus, Type II treated with Metformin  500 mg twice daily. Followed by Dr. Trixie. She said that she occasionally forgets to take a dose of metformin .   History of Hyperlipidemia, 09/11/2024 Lipid panel CHOL 217, HDL 48, Triglycerides 154, LDL 140. Was previously on Crestor  and Zetia  but could not tolerate it.     History of Anxiety treated with alprazolam  0.25 mg twice daily as needed.   She said that she doesn't really get any sleep. She declined  offer for  sleep medication. This is a longstanding problem not new.  History of obesity and metabolic syndrome.    Has annual Diabetic eye exam at [facility].   Had Herpes zoster left trunk in 2006.   History of GE reflux.treated with PPI   Remote history of melanoma 1991 with no recurrence.    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  Mild aortic stenosis with gradient 8.4 mmHg in 2021.  No chest pain or shortness of breath. Does not want to see cardiology for follow up at this time.   Past medical history: Fractured right humerus 2007.  History of appendectomy.  History of ingrown toenail surgery bilaterally.  C-sections in 1976 and 1978.  Labs 09/11/2024  RBC 5.26, Hemoglobin 15.8, HCT 46.7, Blood glucose 122, HgbA1c 6.7%, CHOL 217,  HDL 48, Triglycerides 154, LDL 140, Otherwise WNL.   06/09/2023 Mammogram No mammographic evidence of malignancy. Repeat in one year.   Vaccine counseling: Declined tetanus, Pneumonia, shingles,  and Covid-19 vaccines.   Health maintenance: Eye exam due.   Health Maintenance  Topic Date Due   Diabetic kidney evaluation - Urine ACR  09/03/2024   Medicare Annual Wellness (AWV)  09/10/2024   COVID-19 Vaccine (1) 09/28/2024 (Originally 08/10/1951)   Zoster Vaccines- Shingrix (1 of 2) 12/11/2024 (Originally 08[birth year]11/1963)   OPHTHALMOLOGY EXAM  12/25/2024 (Originally 02/21/2024)   Influenza Vaccine  01/07/2025 (Originally 05/10/2024)   DTaP/Tdap/Td (2 - Td or Tdap) 09/12/2025 (Originally 10/11/2015)   Pneumococcal Vaccine: 50+ Years (1 of 2 - PCV) 09/12/2025 (Originally 08/11/1963)   HEMOGLOBIN A1C  03/12/2025   FOOT EXAM  07/04/2025   Diabetic kidney evaluation - eGFR measurement  09/11/2025   Bone Density Scan  Completed   Meningococcal B Vaccine  Aged Out   Mammogram  Discontinued   Colonoscopy  Discontinued    Review of Systems  Constitutional:  Negative for fever and malaise/fatigue.  HENT:  Negative for congestion.   Eyes:  Negative for blurred vision.  Respiratory:  Negative for cough and shortness of breath.   Cardiovascular:  Negative for chest pain, palpitations and leg swelling.  Gastrointestinal:  Negative for vomiting.  Musculoskeletal:  Negative for back pain.  Skin:  Negative for rash.  Neurological:  Negative for loss of consciousness and headaches.   Objective:  Vitals: body mass index is 26.64 kg/m. Today's Vitals   09/12/24 1114  BP: 120/80  Pulse: 82  SpO2: 97%  Weight: 148 lb (67.1 kg)  Height: 5' 2.5 (1.588 m)  PainSc: 0-No pain   Physical Exam Vitals and nursing note reviewed.  Constitutional:      General: She is not in acute distress.    Appearance: Normal appearance. She is not ill-appearing or toxic-appearing.  HENT:     Head: Normocephalic and  atraumatic.     Right Ear: Hearing, tympanic membrane, ear canal and external ear normal.     Left Ear: Hearing, tympanic membrane, ear canal and external ear normal.     Mouth/Throat:     Pharynx: Oropharynx is clear.  Eyes:     Extraocular Movements: Extraocular movements intact.     Pupils: Pupils are equal, round, and reactive to light.  Neck:     Thyroid: No thyroid mass, thyromegaly or thyroid tenderness.     Vascular: No carotid bruit.  Cardiovascular:     Rate and Rhythm: Normal rate and regular rhythm. No extrasystoles are present.    Pulses:          Dorsalis pedis pulses are 2+ on the right side and 2+ on the left side.     Heart sounds: Murmur heard.     No friction rub. No gallop.     Comments: Holosystolic murmur  Pulmonary:     Effort: Pulmonary effort is normal.     Breath sounds: Normal breath sounds. No decreased breath sounds, wheezing, rhonchi or rales.  Chest:     Chest wall: No mass.     Comments: Several Seborrheic keratosis  Abdominal:     Palpations: Abdomen is soft. There is no hepatomegaly, splenomegaly or mass.     Tenderness: There is no abdominal tenderness.     Hernia: No hernia is present.  Musculoskeletal:     Cervical back: Normal range of motion.     Right lower leg: No edema.     Left lower leg: No edema.  Lymphadenopathy:     Cervical: No cervical adenopathy.     Upper Body:     Right upper body: No supraclavicular adenopathy.     Left upper body: No supraclavicular adenopathy.  Skin:    General: Skin is warm and dry.  Neurological:     General: No focal deficit present.     Mental Status: She is alert and oriented to person, place, and time. Mental status is at baseline.     Sensory: Sensation is intact.     Motor: Motor function is intact. No weakness.     Deep Tendon Reflexes: Reflexes are normal and symmetric.  Psychiatric:        Attention and Perception: Attention normal.        Mood and Affect: Mood normal.        Speech:  Speech normal.        Behavior: Behavior normal.        Thought Content: Thought content normal.        Cognition and Memory: Cognition normal.        Judgment: Judgment normal.     Current Outpatient Medications  Medication Instructions   ALPRAZolam  (XANAX ) 0.25 mg, Oral, 2 times daily PRN   blood glucose meter kit and supplies KIT Dispense based on patient and insurance preference. Use up to four times daily as directed. (FOR ICD-9 250.00, 250.01).   Clobetasol  Prop Emollient  Base (CLOBETASOL  PROPIONATE E) 0.05 % emollient cream APPLY TO AFFECTED AREA EVERY DAY X 2-3 WEEKS,THEN EVERY OTHER DAY X2-3WEEKS,THEN TWICE PER WEEK   glucose blood (ONETOUCH ULTRA TEST) test strip Use to check blood sugar 2 times a day.   glucose blood test strip Use as instructed 2x a day - On Touch Verio device   Lancets (ONETOUCH DELICA PLUS LANCET33G) MISC Use to check blood sugar 2 times a day.   metFORMIN  (GLUCOPHAGE -XR) 500 mg, Oral, 2 times daily   naproxen sodium (ALEVE) 220 mg, 2 times daily with meals   Red Yeast Rice Extract (RED YEAST RICE PO) Take by mouth.   Past Medical History:  Diagnosis Date   Arthritis    Blood pressure elevated    Cancer (HCC)    melanoma   Cholesterol serum elevated    Diabetes mellitus    H/O: cesarean section 1976 and 1978   History of appendectomy age 32   History of tonsillectomy 1965   Hypercholesteremia 05/18/2011   Hyperlipidemia    Hypertension    Melanoma (HCC) 05/18/2011   Medical/Surgical History Narrative:  Allergic/Intolerant to:  Allergies  Allergen Reactions   Codeine    Epinephrine Palpitations    Takes hours for reaction of increase pulse and BP.    Past Surgical History:  Procedure Laterality Date   APPENDECTOMY     CESAREAN SECTION  1976 and 1978   MOHS SURGERY     Nose   TONSILLECTOMY  10/11/1963   Family History  Problem Relation Age of Onset   Diabetes Mother    Heart disease Mother    Hypertension Mother    Hypertension Brother     Diabetes Maternal Grandmother    Heart disease Maternal Grandmother    Hypertension Maternal Grandmother    Hypertension Maternal Grandfather    Hypertension Paternal Grandmother    Hypertension Paternal Grandfather    Family History Narrative: Mother with history of diabetes and hypertension.   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  Husband is retired nurse, adult and has developed dementia.  Non-smoker.  Does not consume alcohol.  Most Recent Health Risks Assessment:   Most Recent Social Determinants of Health (Including Hx of Tobacco, Alcohol, and Drug Use) SDOH Screenings   Food Insecurity: No Food Insecurity (09/12/2024)  Housing: Low Risk  (09/12/2024)  Transportation Needs: No Transportation Needs (09/12/2024)  Utilities: Not At Risk (09/12/2024)  Alcohol Screen: Low Risk  (09/11/2023)  Depression (PHQ2-9): Low Risk  (09/11/2023)  Financial Resource Strain: Low Risk  (09/12/2024)  Physical Activity: Inactive (09/11/2023)  Social Connections: Moderately Isolated (09/12/2024)  Stress: No Stress Concern Present (09/12/2024)  Tobacco Use: Low Risk  (09/12/2024)  Health Literacy: Adequate Health Literacy (09/11/2023)   Social History   Tobacco Use   Smoking status: Never   Smokeless tobacco: Never  Substance Use Topics   Alcohol use: No   Drug use: No    Most Recent Fall Risk Assessment:    09/12/2024   11:06 AM  Fall Risk   Falls in the past year? 0  Number falls in past yr: 0  Injury with Fall? 0  Risk for fall due to : No Fall Risks  Follow up Falls prevention discussed;Education provided;Falls evaluation completed   Most Recent Anxiety/Depression Screenings:    09/11/2023   11:00 AM 09/05/2022   10:44 AM  PHQ 2/9 Scores  PHQ - 2 Score 0 0    Most  Recent Cognitive Screening:    09/12/2024   11:06 AM  6CIT Screen  What Year? 0 points  What month? 0 points  What time? 0 points  Count back from 20 0  points  Months in reverse 0 points  Repeat phrase 0 points  Total Score 0 points    Results:  Studies Obtained And Personally Reviewed By Me:  06/09/2023 Mammogram No mammographic evidence of malignancy. Repeat in one year.   Labs:  CBC w/ Differential Lab Results  Component Value Date   WBC 6.9 09/11/2024   RBC 5.26 (H) 09/11/2024   HGB 15.8 (H) 09/11/2024   HCT 46.7 (H) 09/11/2024   PLT 272 09/11/2024   MCV 88.8 09/11/2024   MCH 30.0 09/11/2024   MCHC 33.8 09/11/2024   RDW 13.0 09/11/2024   MPV 12.2 09/11/2024   LYMPHSABS 2,067 08/29/2022   MONOABS 549 08/02/2016   BASOSABS 83 09/11/2024    Comprehensive Metabolic Panel Lab Results  Component Value Date   NA 140 09/11/2024   K 4.4 09/11/2024   CL 102 09/11/2024   CO2 25 09/11/2024   GLUCOSE 122 (H) 09/11/2024   BUN 12 09/11/2024   CREATININE 0.66 09/11/2024   CALCIUM  9.7 09/11/2024   PROT 6.8 09/11/2024   ALBUMIN 4.0 08/02/2016   AST 19 09/11/2024   ALT 13 09/11/2024   ALKPHOS 51 08/02/2016   BILITOT 0.5 09/11/2024   EGFR 89 09/11/2024   GFRNONAA 80 08/24/2020   Lipid Panel  Lab Results  Component Value Date   CHOL 217 (H) 09/11/2024   HDL 48 (L) 09/11/2024   LDLCALC 140 (H) 09/11/2024   TRIG 154 (H) 09/11/2024   A1c Lab Results  Component Value Date   HGBA1C 6.7 (H) 09/11/2024    TSH Lab Results  Component Value Date   TSH 1.25 09/11/2024    Assessment & Plan:   Orders Placed This Encounter  Procedures   Microalbumin / creatinine urine ratio   POCT URINALYSIS DIP (CLINITEK)   Diabetes Mellitus, Type II: treated with Metformin  500 mg twice daily. Followed by Dr. Trixie. She said that she occasionally forgets to take a dose of metformin .   Hyperlipidemia: 09/11/2024 Lipid panel CHOL 217, HDL 48, Triglycerides 154, LDL 140. Was previously on Crestor  and Zetia  but could not tolerate it.     Anxiety: treated with alprazolam  0.25 mg twice daily as needed.   She said that she doesn't  really get any sleep. She declined any sleep medication   06/09/2023 Mammogram No mammographic evidence of malignancy. Repeat in one year.   Vaccine counseling: Declined tetanus, Pneumonia, shingles,  and Covid-19 vaccines.   Health maintenance: Eye exam due.      Annual Wellness Visit done today including the all of the following: Reviewed patient's Family Medical History Reviewed patient's SDOH and reviewed tobacco, alcohol, and drug use.  Reviewed and updated list of patient's medical providers Assessment of cognitive impairment was done Assessed patient's functional ability Established a written schedule for health screening services Health Risk Assessent Completed and Reviewed  Discussed health benefits of physical activity, and encouraged her to engage in regular exercise appropriate for her age and condition.   I,Makayla C Reid,acting as a scribe for Ronal JINNY Hailstone, MD.,have documented all relevant documentation on the behalf of Ronal JINNY Hailstone, MD,as directed by  Ronal JINNY Hailstone, MD while in the presence of Ronal JINNY Hailstone, MD.  I, Ronal JINNY Hailstone, MD, have reviewed all documentation for and agree with  the above Annual Wellness Visit documentation.  Ronal JINNY Hailstone, MD Internal Medicine 09/12/2024 "

## 2024-09-10 ENCOUNTER — Other Ambulatory Visit: Payer: Medicare Other

## 2024-09-10 DIAGNOSIS — Z0000 Encounter for general adult medical examination without abnormal findings: Secondary | ICD-10-CM

## 2024-09-10 DIAGNOSIS — E1169 Type 2 diabetes mellitus with other specified complication: Secondary | ICD-10-CM

## 2024-09-10 DIAGNOSIS — E782 Mixed hyperlipidemia: Secondary | ICD-10-CM

## 2024-09-10 DIAGNOSIS — I10 Essential (primary) hypertension: Secondary | ICD-10-CM

## 2024-09-10 DIAGNOSIS — E1165 Type 2 diabetes mellitus with hyperglycemia: Secondary | ICD-10-CM

## 2024-09-11 ENCOUNTER — Other Ambulatory Visit

## 2024-09-11 DIAGNOSIS — E1165 Type 2 diabetes mellitus with hyperglycemia: Secondary | ICD-10-CM

## 2024-09-11 DIAGNOSIS — E1169 Type 2 diabetes mellitus with other specified complication: Secondary | ICD-10-CM

## 2024-09-11 DIAGNOSIS — E782 Mixed hyperlipidemia: Secondary | ICD-10-CM

## 2024-09-11 DIAGNOSIS — I10 Essential (primary) hypertension: Secondary | ICD-10-CM

## 2024-09-11 DIAGNOSIS — K580 Irritable bowel syndrome with diarrhea: Secondary | ICD-10-CM

## 2024-09-11 DIAGNOSIS — E7800 Pure hypercholesterolemia, unspecified: Secondary | ICD-10-CM

## 2024-09-11 DIAGNOSIS — Z0000 Encounter for general adult medical examination without abnormal findings: Secondary | ICD-10-CM

## 2024-09-12 ENCOUNTER — Encounter: Payer: Self-pay | Admitting: Internal Medicine

## 2024-09-12 ENCOUNTER — Ambulatory Visit: Payer: Medicare Other | Admitting: Internal Medicine

## 2024-09-12 VITALS — BP 120/80 | HR 82 | Ht 62.5 in | Wt 148.0 lb

## 2024-09-12 DIAGNOSIS — Z85820 Personal history of malignant melanoma of skin: Secondary | ICD-10-CM

## 2024-09-12 DIAGNOSIS — E1165 Type 2 diabetes mellitus with hyperglycemia: Secondary | ICD-10-CM

## 2024-09-12 DIAGNOSIS — Z0000 Encounter for general adult medical examination without abnormal findings: Secondary | ICD-10-CM | POA: Diagnosis not present

## 2024-09-12 DIAGNOSIS — R748 Abnormal levels of other serum enzymes: Secondary | ICD-10-CM

## 2024-09-12 DIAGNOSIS — E785 Hyperlipidemia, unspecified: Secondary | ICD-10-CM

## 2024-09-12 DIAGNOSIS — F419 Anxiety disorder, unspecified: Secondary | ICD-10-CM | POA: Diagnosis not present

## 2024-09-12 DIAGNOSIS — E1169 Type 2 diabetes mellitus with other specified complication: Secondary | ICD-10-CM | POA: Diagnosis not present

## 2024-09-12 DIAGNOSIS — K580 Irritable bowel syndrome with diarrhea: Secondary | ICD-10-CM

## 2024-09-12 DIAGNOSIS — Z8719 Personal history of other diseases of the digestive system: Secondary | ICD-10-CM

## 2024-09-12 DIAGNOSIS — Z789 Other specified health status: Secondary | ICD-10-CM

## 2024-09-12 DIAGNOSIS — I350 Nonrheumatic aortic (valve) stenosis: Secondary | ICD-10-CM

## 2024-09-12 DIAGNOSIS — I10 Essential (primary) hypertension: Secondary | ICD-10-CM

## 2024-09-12 DIAGNOSIS — Z6826 Body mass index (BMI) 26.0-26.9, adult: Secondary | ICD-10-CM

## 2024-09-12 DIAGNOSIS — E782 Mixed hyperlipidemia: Secondary | ICD-10-CM

## 2024-09-13 LAB — MICROALBUMIN / CREATININE URINE RATIO
Creatinine, Urine: 124 mg/dL (ref 20–275)
Microalb Creat Ratio: 11 mg/g{creat} (ref <30)
Microalb, Ur: 1.4 mg/dL

## 2024-09-16 LAB — COMPREHENSIVE METABOLIC PANEL WITH GFR
AG Ratio: 1.8 (calc) (ref 1.0–2.5)
ALT: 13 U/L (ref 6–29)
AST: 19 U/L (ref 10–35)
Albumin: 4.4 g/dL (ref 3.6–5.1)
Alkaline phosphatase (APISO): 51 U/L (ref 37–153)
BUN: 12 mg/dL (ref 7–25)
CO2: 25 mmol/L (ref 20–32)
Calcium: 9.7 mg/dL (ref 8.6–10.4)
Chloride: 102 mmol/L (ref 98–110)
Creat: 0.66 mg/dL (ref 0.60–0.95)
Globulin: 2.4 g/dL (ref 1.9–3.7)
Glucose, Bld: 122 mg/dL — ABNORMAL HIGH (ref 65–99)
Potassium: 4.4 mmol/L (ref 3.5–5.3)
Sodium: 140 mmol/L (ref 135–146)
Total Bilirubin: 0.5 mg/dL (ref 0.2–1.2)
Total Protein: 6.8 g/dL (ref 6.1–8.1)
eGFR: 89 mL/min/1.73m2 (ref >=60)

## 2024-09-16 LAB — LIPID PANEL
Cholesterol: 217 mg/dL — ABNORMAL HIGH (ref <200)
HDL: 48 mg/dL — ABNORMAL LOW (ref >=50)
LDL Cholesterol (Calc): 140 mg/dL — ABNORMAL HIGH
Non-HDL Cholesterol (Calc): 169 mg/dL — ABNORMAL HIGH (ref <130)
Total CHOL/HDL Ratio: 4.5 (calc) (ref <5.0)
Triglycerides: 154 mg/dL — ABNORMAL HIGH (ref <150)

## 2024-09-16 LAB — CBC WITH DIFFERENTIAL/PLATELET
Absolute Lymphocytes: 2215 {cells}/uL (ref 850–3900)
Absolute Monocytes: 759 {cells}/uL (ref 200–950)
Basophils Absolute: 83 {cells}/uL (ref 0–200)
Basophils Relative: 1.2 %
Eosinophils Absolute: 380 {cells}/uL (ref 15–500)
Eosinophils Relative: 5.5 %
HCT: 46.7 % — ABNORMAL HIGH (ref 35.9–46.0)
Hemoglobin: 15.8 g/dL — ABNORMAL HIGH (ref 11.7–15.5)
MCH: 30 pg (ref 27.0–33.0)
MCHC: 33.8 g/dL (ref 31.6–35.4)
MCV: 88.8 fL (ref 81.4–101.7)
MPV: 12.2 fL (ref 7.5–12.5)
Monocytes Relative: 11 %
Neutro Abs: 3464 {cells}/uL (ref 1500–7800)
Neutrophils Relative %: 50.2 %
Platelets: 272 Thousand/uL (ref 140–400)
RBC: 5.26 Million/uL — ABNORMAL HIGH (ref 3.80–5.10)
RDW: 13 % (ref 11.0–15.0)
Total Lymphocyte: 32.1 %
WBC: 6.9 Thousand/uL (ref 3.8–10.8)

## 2024-09-16 LAB — HEMOGLOBIN A1C
Hgb A1c MFr Bld: 6.7 % — ABNORMAL HIGH (ref <5.7)
Mean Plasma Glucose: 146 mg/dL
eAG (mmol/L): 8.1 mmol/L

## 2024-09-16 LAB — TSH: TSH: 1.25 m[IU]/L (ref 0.40–4.50)

## 2024-10-03 ENCOUNTER — Encounter: Payer: Self-pay | Admitting: Internal Medicine

## 2024-10-03 NOTE — Patient Instructions (Signed)
 As always it was a pleasure to see you today. Continue diet and exercise efforts. Continue current medications. Return in one year or as needed.

## 2024-10-25 ENCOUNTER — Other Ambulatory Visit (HOSPITAL_COMMUNITY): Payer: Self-pay

## 2025-01-01 ENCOUNTER — Ambulatory Visit: Admitting: Internal Medicine
